# Patient Record
Sex: Male | Born: 1990 | Hispanic: No | Marital: Single | Smoking: Never smoker
Health system: Southern US, Community
[De-identification: ages and names within clinical notes are randomized; demographics above are authoritative.]

## PROBLEM LIST (undated history)

## (undated) DIAGNOSIS — F419 Anxiety disorder, unspecified: Secondary | ICD-10-CM

## (undated) DIAGNOSIS — F79 Unspecified intellectual disabilities: Secondary | ICD-10-CM

## (undated) DIAGNOSIS — F952 Tourette's disorder: Secondary | ICD-10-CM

## (undated) DIAGNOSIS — J189 Pneumonia, unspecified organism: Secondary | ICD-10-CM

## (undated) DIAGNOSIS — R4701 Aphasia: Secondary | ICD-10-CM

## (undated) DIAGNOSIS — B351 Tinea unguium: Secondary | ICD-10-CM

## (undated) DIAGNOSIS — F71 Moderate intellectual disabilities: Secondary | ICD-10-CM

## (undated) DIAGNOSIS — K219 Gastro-esophageal reflux disease without esophagitis: Secondary | ICD-10-CM

## (undated) DIAGNOSIS — F909 Attention-deficit hyperactivity disorder, unspecified type: Secondary | ICD-10-CM

## (undated) DIAGNOSIS — F840 Autistic disorder: Secondary | ICD-10-CM

---

## 2011-01-10 ENCOUNTER — Emergency Department (HOSPITAL_COMMUNITY)
Admission: EM | Admit: 2011-01-10 | Discharge: 2011-01-10 | Disposition: A | Discharge status: Home / Self Care | Payer: Medicaid Other | Attending: Emergency Medicine | Admitting: Emergency Medicine

## 2011-01-10 ENCOUNTER — Encounter: Payer: Self-pay | Admitting: Emergency Medicine

## 2011-01-10 DIAGNOSIS — F79 Unspecified intellectual disabilities: Secondary | ICD-10-CM | POA: Insufficient documentation

## 2011-01-10 DIAGNOSIS — R630 Anorexia: Secondary | ICD-10-CM | POA: Insufficient documentation

## 2011-01-10 DIAGNOSIS — F909 Attention-deficit hyperactivity disorder, unspecified type: Secondary | ICD-10-CM | POA: Insufficient documentation

## 2011-01-10 DIAGNOSIS — IMO0002 Reserved for concepts with insufficient information to code with codable children: Secondary | ICD-10-CM | POA: Insufficient documentation

## 2011-01-10 DIAGNOSIS — Z79899 Other long term (current) drug therapy: Secondary | ICD-10-CM | POA: Insufficient documentation

## 2011-01-10 DIAGNOSIS — F840 Autistic disorder: Secondary | ICD-10-CM | POA: Insufficient documentation

## 2011-01-10 DIAGNOSIS — R109 Unspecified abdominal pain: Secondary | ICD-10-CM | POA: Insufficient documentation

## 2011-01-10 DIAGNOSIS — R451 Restlessness and agitation: Secondary | ICD-10-CM

## 2011-01-10 HISTORY — DX: Attention-deficit hyperactivity disorder, unspecified type: F90.9

## 2011-01-10 HISTORY — DX: Autistic disorder: F84.0

## 2011-01-10 HISTORY — DX: Unspecified intellectual disabilities: F79

## 2011-01-10 LAB — CBC
HCT: 39.9 % (ref 39.0–52.0)
Hemoglobin: 14 g/dL (ref 13.0–17.0)
MCHC: 35.1 g/dL (ref 30.0–36.0)
RBC: 4.53 MIL/uL (ref 4.22–5.81)

## 2011-01-10 LAB — COMPREHENSIVE METABOLIC PANEL
Albumin: 4.1 g/dL (ref 3.5–5.2)
Alkaline Phosphatase: 109 U/L (ref 39–117)
BUN: 11 mg/dL (ref 6–23)
CO2: 26 mEq/L (ref 19–32)
Chloride: 103 mEq/L (ref 96–112)
Creatinine, Ser: 0.72 mg/dL (ref 0.50–1.35)
GFR calc Af Amer: >90 mL/min (ref >90)
GFR calc non Af Amer: >90 mL/min (ref >90)
Glucose, Bld: 100 mg/dL — ABNORMAL HIGH (ref 70–99)
Total Bilirubin: 0.4 mg/dL (ref 0.3–1.2)

## 2011-01-10 LAB — DIFFERENTIAL
Basophils Relative: 0 % (ref 0–1)
Lymphs Abs: 3 10*3/uL (ref 0.7–4.0)
Monocytes Absolute: 0.5 10*3/uL (ref 0.1–1.0)
Monocytes Relative: 7 % (ref 3–12)
Neutro Abs: 3.8 10*3/uL (ref 1.7–7.7)

## 2011-01-10 LAB — LIPASE, BLOOD: Lipase: 68 U/L — ABNORMAL HIGH (ref 11–59)

## 2011-01-10 MED ORDER — LORAZEPAM 1 MG PO TABS: 1.0000 mg | ORAL_TABLET | Freq: Three times a day (TID) | ORAL | Status: AC | PRN

## 2011-01-10 MED ORDER — LORAZEPAM 1 MG PO TABS
1.0000 mg | ORAL_TABLET | Freq: Once | ORAL | Status: AC
  Administered 2011-01-10: 1 mg via ORAL
  Filled 2011-01-10: qty 1

## 2011-01-10 NOTE — ED Notes (Signed)
Caregiver reports since starting new medicine, megace one month ago pt 's behavior has become very aggressive, frequent crying, decreased appetite, decreased BM's. Pt hitting, kicking, falling on floor, grabbing other people's clothes. Caregiver also reports pt cries when sitting on commode.  Reports went to PCP office today but was instructed to go to ED because pt was acting out & too aggressive in office.

## 2011-01-10 NOTE — ED Notes (Signed)
Per  Group home pt is austic non verble and has been  Getting more and more disruptive  After going to the [facility] caregiver states stools have hard  More and more disruptive

## 2011-01-10 NOTE — ED Provider Notes (Signed)
History     CSN: [id number]  Arrival date & time 01/10/11  1226   First MD Initiated Contact with Patient 01/10/11 1426      Chief Complaint  Patient presents with  . Abdominal Pain    (Consider location/radiation/quality/duration/timing/severity/associated sxs/prior treatment) Patient is a 21 y.o. male presenting with abdominal pain. The history is provided by a caregiver. The history is limited by a developmental delay.  Abdominal Pain The primary symptoms of the illness include abdominal pain.   the actual chief complaint is agitation. About one month ago, he was noted to have a decrease in appetite and he was started on megestrol and intent to increase his appetite. His appetite has not improved, but he started getting angry and disruptive shortly after starting on that medication. The behavior change has gotten worse progressively. Today, he was taken in to see his doctor and his behavior was extremely disruptive and caregiver was advised to take him to the hospital. He has not had any observed fever. He has not had any vomiting or diarrhea. There was some concern that he may be constipated and has been treated with fluids and fiber with no benefit. Symptoms are severe.  Past Medical History  Diagnosis Date  . Mental retardation   . Autism   . ADHD (attention deficit hyperactivity disorder)     History reviewed. No pertinent past surgical history.  History reviewed. No pertinent family history.  History  Substance Use Topics  . Smoking status: Never Smoker   . Smokeless tobacco: Not on file  . Alcohol Use: No      Review of Systems  Unable to perform ROS: Psychiatric disorder  Gastrointestinal: Positive for abdominal pain.    Allergies  Review of patient's allergies indicates no known allergies.  Home Medications   Current Outpatient Rx  Name Route Sig Dispense Refill  . CETIRIZINE HCL 10 MG PO TABS Oral Take 10 mg by mouth daily.      Marland Kitchen CLINDAMYCIN  PHOS-BENZOYL PEROX 1-5 % EX GEL Topical Apply 1 application topically 2 (two) times daily.      Marland Kitchen CLONIDINE HCL 0.1 MG PO TABS Oral Take 0.1 mg by mouth at bedtime.      . ENSURE PO LIQD Oral Take 237 mLs by mouth 3 (three) times daily between meals.      . MEGESTROL ACETATE 20 MG PO TABS Oral Take 10 mg by mouth daily.      Marland Kitchen RISPERIDONE 1 MG PO TABS Oral Take 1 mg by mouth 3 (three) times daily.        BP 125/83  Pulse 112  Temp(Src) 96.8 F (36 C) (Oral)  Resp 18  SpO2 95%  Physical Exam  Nursing note and vitals reviewed.  21 year old male who is frequently agitated and is requiring physical restraints to protect staff and himself. Currently, there is restraints are being done manually rather than with leather restraints. Vital signs are significant for mild tachycardia with heart rate of 112. Oxygen saturations 95% which is normal. Head is normocephalic and atraumatic. PERRLA, EOMI. There is no scleral icterus. His membranes are moist. Neck is supple without adenopathy. Lungs are clear without rales, wheezes, rhonchi. Heart has regular rate and rhythm without murmur. Back is nontender. There is no chest wall tenderness. Abdomen is soft, flat, nontender without masses or hepatosplenomegaly and peristalsis is normal active. Jimmy's have no cyanosis or edema. No deformities are seen. Skin is warm and moist without rash. Neurologic: He is nonverbal  and does not follow commands but he does have purposeful movement and he does observe interact with his environment. Cranial nerves are intact. There no focal motor or sensory deficits.  ED Course  Procedures (including critical care time)   Labs Reviewed  CBC  DIFFERENTIAL  COMPREHENSIVE METABOLIC PANEL  LIPASE, BLOOD  URINALYSIS, ROUTINE W REFLEX MICROSCOPIC   No results found.  Results for orders placed during the hospital encounter of 01/10/11  CBC      Component Value Range   WBC 7.3  4.0 - 10.5 (K/uL)   RBC 4.53  4.22 - 5.81  (MIL/uL)   Hemoglobin 14.0  13.0 - 17.0 (g/dL)   HCT 16.1  09.6 - 04.5 (%)   MCV 88.1  78.0 - 100.0 (fL)   MCH 30.9  26.0 - 34.0 (pg)   MCHC 35.1  30.0 - 36.0 (g/dL)   RDW 40.9  81.1 - 91.4 (%)   Platelets 196  150 - 400 (K/uL)  DIFFERENTIAL      Component Value Range   Neutrophils Relative 52  43 - 77 (%)   Neutro Abs 3.8  1.7 - 7.7 (K/uL)   Lymphocytes Relative 40  12 - 46 (%)   Lymphs Abs 3.0  0.7 - 4.0 (K/uL)   Monocytes Relative 7  3 - 12 (%)   Monocytes Absolute 0.5  0.1 - 1.0 (K/uL)   Eosinophils Relative 1  0 - 5 (%)   Eosinophils Absolute 0.1  0.0 - 0.7 (K/uL)   Basophils Relative 0  0 - 1 (%)   Basophils Absolute 0.0  0.0 - 0.1 (K/uL)  COMPREHENSIVE METABOLIC PANEL      Component Value Range   Sodium 139  135 - 145 (mEq/L)   Potassium 3.7  3.5 - 5.1 (mEq/L)   Chloride 103  96 - 112 (mEq/L)   CO2 26  19 - 32 (mEq/L)   Glucose, Bld 100 (*) 70 - 99 (mg/dL)   BUN 11  6 - 23 (mg/dL)   Creatinine, Ser 7.82  0.50 - 1.35 (mg/dL)   Calcium 9.4  8.4 - 95.6 (mg/dL)   Total Protein 7.9  6.0 - 8.3 (g/dL)   Albumin 4.1  3.5 - 5.2 (g/dL)   AST 17  0 - 37 (U/L)   ALT <5  0 - 53 (U/L)   Alkaline Phosphatase 109  39 - 117 (U/L)   Total Bilirubin 0.4  0.3 - 1.2 (mg/dL)   GFR calc non Af Amer >90  >90 (mL/min)   GFR calc Af Amer >90  >90 (mL/min)  LIPASE, BLOOD      Component Value Range   Lipase 68 (*) 11 - 59 (U/L)   No results found.   No diagnosis found.  He was given a dose of Ativan and following this, he calmed down considerably and did not have to be restrained anymore. At this point, he is resting comfortably and does not appear to be in any distress whatsoever. Laboratory workup was significant for mildly elevated lipase. Clinically, I do not feel he has pancreatitis. He does not appear to have significant abdominal pain and has not had any vomiting. He also has no risk factors for developing pancreatitis-specifically, no excess alcohol intake, no known gallstones, no  known hypertriglyceridemia. He will be given a prescription for Ativan to take as needed since he did seem to get good relief of his agitation in the emergency department tonight. In addition, I am suggesting that they discontinue megestrol  to see if that was contributing to his behavior.  Impression: agitation, possibly related to megestrol.  MDM  Behavior change which may be related to starting megestrol. I see no evidence of significant abdominal pathology. Laboratory workup will be done to ensure there is nonmedical urinary tract infection and no evidence of occult gastrointestinal problem. If workup is negative, I will recommend discontinuing megestrol to see if he improves with that.        Dione Booze, MD 01/10/11 1556

## 2011-01-10 NOTE — Discharge Instructions (Signed)
Please discontinue megestrol. This maybe be what is causing his agitation. It will take about one week off of megestrol to see if it is indeed contributing to his agitation. If he is not showing significant improvement in one week, he will need to be reevaluated for possible additional medication to control his agitation. In the meantime, give Ativan 1 mg as often as 3 times a day as needed to control his agitation.

## 2012-12-03 DIAGNOSIS — J189 Pneumonia, unspecified organism: Secondary | ICD-10-CM

## 2012-12-03 HISTORY — DX: Pneumonia, unspecified organism: J18.9

## 2012-12-29 ENCOUNTER — Encounter (HOSPITAL_COMMUNITY): Payer: Self-pay | Admitting: Emergency Medicine

## 2012-12-29 ENCOUNTER — Emergency Department (HOSPITAL_COMMUNITY)
Admission: EM | Admit: 2012-12-29 | Discharge: 2012-12-29 | Discharge status: Against Medical Advice | Payer: Medicaid Other | Attending: Emergency Medicine | Admitting: Emergency Medicine

## 2012-12-29 DIAGNOSIS — R509 Fever, unspecified: Secondary | ICD-10-CM | POA: Insufficient documentation

## 2012-12-29 MED ORDER — IBUPROFEN 200 MG PO TABS
600.0000 mg | ORAL_TABLET | Freq: Once | ORAL | Status: AC
  Administered 2012-12-29: 600 mg via ORAL
  Filled 2012-12-29: qty 3

## 2012-12-29 NOTE — ED Notes (Signed)
[name] mom gave him 400 mg tylenol about 2 hrs ago.

## 2012-12-29 NOTE — ED Notes (Signed)
Pt is in foster care and has been for 8 years.  Pt is autistic.  [name] mom states that he has been having fevers since yesterday.  States that she has been giving him fluids and tylenol around the clock but cannot seem to get his fever to go down.  Denies NVD.  States that he has a nonproductive cough but seems like he is trying to get something up.  Pt is nonverbal.

## 2012-12-29 NOTE — ED Notes (Signed)
Pt to registration window stating they weren't waiting any longer

## 2012-12-30 ENCOUNTER — Encounter (HOSPITAL_COMMUNITY): Payer: Self-pay | Admitting: Emergency Medicine

## 2012-12-30 ENCOUNTER — Emergency Department (HOSPITAL_COMMUNITY)
Admission: EM | Admit: 2012-12-30 | Discharge: 2012-12-30 | Disposition: A | Discharge status: Home / Self Care | Payer: Medicaid Other | Attending: Emergency Medicine | Admitting: Emergency Medicine

## 2012-12-30 ENCOUNTER — Emergency Department (HOSPITAL_COMMUNITY): Payer: Medicaid Other

## 2012-12-30 DIAGNOSIS — F840 Autistic disorder: Secondary | ICD-10-CM | POA: Insufficient documentation

## 2012-12-30 DIAGNOSIS — Z79899 Other long term (current) drug therapy: Secondary | ICD-10-CM | POA: Insufficient documentation

## 2012-12-30 DIAGNOSIS — R05 Cough: Secondary | ICD-10-CM

## 2012-12-30 DIAGNOSIS — F909 Attention-deficit hyperactivity disorder, unspecified type: Secondary | ICD-10-CM | POA: Insufficient documentation

## 2012-12-30 DIAGNOSIS — Z792 Long term (current) use of antibiotics: Secondary | ICD-10-CM | POA: Insufficient documentation

## 2012-12-30 DIAGNOSIS — R059 Cough, unspecified: Secondary | ICD-10-CM

## 2012-12-30 DIAGNOSIS — J159 Unspecified bacterial pneumonia: Secondary | ICD-10-CM | POA: Insufficient documentation

## 2012-12-30 DIAGNOSIS — J189 Pneumonia, unspecified organism: Secondary | ICD-10-CM

## 2012-12-30 DIAGNOSIS — R197 Diarrhea, unspecified: Secondary | ICD-10-CM

## 2012-12-30 DIAGNOSIS — R509 Fever, unspecified: Secondary | ICD-10-CM

## 2012-12-30 MED ORDER — AZITHROMYCIN 250 MG PO TABS: 250.0000 mg | ORAL_TABLET | Freq: Every day | ORAL | Status: DC

## 2012-12-30 MED ORDER — AZITHROMYCIN 250 MG PO TABS
500.0000 mg | ORAL_TABLET | Freq: Once | ORAL | Status: AC
  Administered 2012-12-30: 500 mg via ORAL
  Filled 2012-12-30: qty 2

## 2012-12-30 NOTE — ED Provider Notes (Signed)
CSN: [id number]     Arrival date & time 12/30/12  7829 History   First MD Initiated Contact with Patient 12/30/12 [phone]     Chief Complaint  Patient presents with  . Diarrhea   (Consider location/radiation/quality/duration/timing/severity/associated sxs/prior Treatment) HPI A LEVEL 5 CAVEAT PERTAINS DUE TO NONVERBAL PATIENT Pt with hx of autism, MR presenting with fever, cough and diarrhea.  Symptoms started yesterday morning with fever.  Cough began last night along with several loose stools- no blood or mucous.  No sick contacts.  He has continued drinking liquids.  No vomiting.  No difficulty breathing.  Pt is nonverbal and he is not able to contribute to the history.  Hx obtained from his caregiver.    Past Medical History  Diagnosis Date  . Mental retardation   . Autism   . ADHD (attention deficit hyperactivity disorder)    History reviewed. No pertinent past surgical history. No family history on file. History  Substance Use Topics  . Smoking status: Never Smoker   . Smokeless tobacco: Not on file  . Alcohol Use: No    Review of Systems UNABLE TO OBTAIN ROS DUE TO LEVEL 5 CAVEAT  Allergies  Review of patient's allergies indicates no known allergies.  Home Medications   Current Outpatient Rx  Name  Route  Sig  Dispense  Refill  . cetirizine (ZYRTEC) 10 MG tablet   Oral   Take 10 mg by mouth daily.          . clindamycin-benzoyl peroxide (BENZACLIN) gel   Topical   Apply 1 application topically 2 (two) times daily.          . cloNIDine (CATAPRES) 0.1 MG tablet   Oral   Take 0.1 mg by mouth at bedtime.           . risperiDONE (RISPERDAL) 1 MG tablet   Oral   Take 1 mg by mouth 3 (three) times daily.           Marland Kitchen azithromycin (ZITHROMAX Z-PAK) 250 MG tablet   Oral   Take 1 tablet (250 mg total) by mouth daily.   4 tablet   0    BP 110/86  Pulse 121  Temp(Src) 98.2 F (36.8 C) (Oral)  Resp 18  SpO2 98% Vitals reviewed Physical Exam Physical  Examination: General appearance - alert, well appearing, and in no distress Mental status - alert, unable to assess orientation, nonverbal at baseline but following some commands, at his baseline per caregiver Eyes - no scleral icterus, no conjunctival injection Ears - bilateral TM's and external ear canals normal Mouth - mucous membranes moist, pharynx normal without lesions Chest - clear to auscultation, no wheezes, rales or rhonchi, symmetric air entry, no increased respiratory effort Heart - normal rate, regular rhythm, normal S1, S2, no murmurs, rubs, clicks or gallops Abdomen - soft, nontender, nondistended, no masses or organomegaly Extremities - peripheral pulses normal, no pedal edema, no clubbing or cyanosis Skin - normal coloration and turgor, no rashes  ED Course  Procedures (including critical care time) Labs Review Labs Reviewed - No data to display Imaging Review Dg Chest 2 View  12/30/2012   CLINICAL DATA:  Cough and fever with nausea and vomiting and diarrhea.  EXAM: CHEST  2 VIEW  COMPARISON:  None.  FINDINGS: The lungs are mildly hyperinflated. There are coarse lung markings projecting over the lower thoracic spine on the lateral film which the likely lie on the right consistent with atelectasis or pneumonia.  The cardiopericardial silhouette is normal in size. The pulmonary vascularity is not engorged. The mediastinum is normal in width. There is no pleural effusion or pneumothorax.  IMPRESSION: The findings are consistent with a right lower lobe pneumonia posteriorly. There is likely underlying reactive airway disease.   Electronically Signed   By: David  [place]   On: 12/30/2012 10:58    EKG Interpretation   None       MDM   1. Community acquired pneumonia   2. Fever   3. Cough   4. Diarrhea    Pt presenting with c/o fever, cough and diarrhea.  CXR reveals pneumonia, xray images reviewed and interpreted by me as well.  Pt appears overall nontoxic and well  hydrated.  Vitalls improving.  Given first dose of antibiotics in the ED.  Discharged with strict return precautions.  Caregiver agreeable with plan.    Ethelda Chick, MD 12/30/12 1323

## 2012-12-30 NOTE — ED Notes (Signed)
Per caregiver, fever on and off since Fri-temp this am 103.0-was given 2 ibuprofen at 0739-diarrhea/cough started last night-

## 2013-01-01 ENCOUNTER — Emergency Department (HOSPITAL_COMMUNITY)
Admission: EM | Admit: 2013-01-01 | Discharge: 2013-01-01 | Disposition: A | Discharge status: Home / Self Care | Payer: Medicaid Other | Attending: Emergency Medicine | Admitting: Emergency Medicine

## 2013-01-01 ENCOUNTER — Emergency Department (INDEPENDENT_AMBULATORY_CARE_PROVIDER_SITE_OTHER): Payer: Medicaid Other

## 2013-01-01 ENCOUNTER — Encounter (HOSPITAL_COMMUNITY): Payer: Self-pay | Admitting: Emergency Medicine

## 2013-01-01 ENCOUNTER — Emergency Department (HOSPITAL_COMMUNITY)
Admission: EM | Admit: 2013-01-01 | Discharge: 2013-01-01 | Disposition: A | Discharge status: Nursing Home (Includes ICF) | Payer: Medicaid Other | Source: Home / Self Care

## 2013-01-01 DIAGNOSIS — F909 Attention-deficit hyperactivity disorder, unspecified type: Secondary | ICD-10-CM | POA: Insufficient documentation

## 2013-01-01 DIAGNOSIS — F840 Autistic disorder: Secondary | ICD-10-CM | POA: Insufficient documentation

## 2013-01-01 DIAGNOSIS — J189 Pneumonia, unspecified organism: Secondary | ICD-10-CM

## 2013-01-01 DIAGNOSIS — R197 Diarrhea, unspecified: Secondary | ICD-10-CM

## 2013-01-01 DIAGNOSIS — R509 Fever, unspecified: Secondary | ICD-10-CM | POA: Insufficient documentation

## 2013-01-01 DIAGNOSIS — Z79899 Other long term (current) drug therapy: Secondary | ICD-10-CM | POA: Insufficient documentation

## 2013-01-01 DIAGNOSIS — F79 Unspecified intellectual disabilities: Secondary | ICD-10-CM | POA: Insufficient documentation

## 2013-01-01 LAB — CBC WITH DIFFERENTIAL/PLATELET
Basophils Absolute: 0 10*3/uL (ref 0.0–0.1)
Basophils Relative: 0 % (ref 0–1)
Lymphocytes Relative: 20 % (ref 12–46)
MCHC: 35.3 g/dL (ref 30.0–36.0)
Neutro Abs: 3.3 10*3/uL (ref 1.7–7.7)
Neutrophils Relative %: 72 % (ref 43–77)
Platelets: 144 10*3/uL — ABNORMAL LOW (ref 150–400)
RDW: 12.1 % (ref 11.5–15.5)
WBC: 4.5 10*3/uL (ref 4.0–10.5)

## 2013-01-01 LAB — BASIC METABOLIC PANEL
BUN: 6 mg/dL (ref 6–23)
CO2: 24 mEq/L (ref 19–32)
Calcium: 8.4 mg/dL (ref 8.4–10.5)
Chloride: 100 mEq/L (ref 96–112)
Creatinine, Ser: 0.7 mg/dL (ref 0.50–1.35)
GFR calc Af Amer: >90 mL/min (ref >90)
GFR calc non Af Amer: >90 mL/min (ref >90)
Glucose, Bld: 160 mg/dL — ABNORMAL HIGH (ref 70–99)
Potassium: 3.9 mEq/L (ref 3.7–5.3)
Sodium: 139 mEq/L (ref 137–147)

## 2013-01-01 MED ORDER — LEVOFLOXACIN IN D5W 750 MG/150ML IV SOLN
750.0000 mg | Freq: Once | INTRAVENOUS | Status: AC
  Administered 2013-01-01: 750 mg via INTRAVENOUS
  Filled 2013-01-01: qty 150

## 2013-01-01 MED ORDER — LEVOFLOXACIN 750 MG PO TABS: 750.0000 mg | ORAL_TABLET | Freq: Every day | ORAL | Status: DC

## 2013-01-01 NOTE — ED Notes (Signed)
pcp told caregiver to bring patient to ucc for a chest xray.  Diagnosed Sunday with pneumonia

## 2013-01-01 NOTE — ED Provider Notes (Signed)
CSN: [id number]     Arrival date & time 01/01/13  1449 History   First MD Initiated Contact with Patient 01/01/13 1540     Chief Complaint  Patient presents with  . Pneumonia   (Consider location/radiation/quality/duration/timing/severity/associated sxs/prior Treatment) HPI Comments: Patient sent to the emergency department from urgent care for repeat evaluation of pneumonia. Patient was seen in the emergency department [facility] long days ago for fever and was diagnosed with pneumonia. Patient was started on Zithromax. He has been getting the medication as instructed. He was supposed to followup with his primary care doctor today, there were no appointments. Patient went to urgent care and a repeat x-ray showed worsening of the pneumonia, so patient was sent to the ER. Caregiver reports that he has been whiny, like he feels bad, but no specific findings. No vomiting or diarrhea. Has continued to run a fever, but is not exhibiting any difficulty breathing. Patient is a difficult examination because of his history of autism and mental retardation. All information provided by caregiver.  Patient is a 22 y.o. male presenting with pneumonia.  Pneumonia Pertinent negatives include no shortness of breath.    Past Medical History  Diagnosis Date  . Mental retardation   . Autism   . ADHD (attention deficit hyperactivity disorder)    History reviewed. No pertinent past surgical history. History reviewed. No pertinent family history. History  Substance Use Topics  . Smoking status: Never Smoker   . Smokeless tobacco: Not on file  . Alcohol Use: No    Review of Systems  Constitutional: Positive for fever.  Respiratory: Positive for cough. Negative for shortness of breath.   All other systems reviewed and are negative.    Allergies  Review of patient's allergies indicates no known allergies.  Home Medications   Current Outpatient Rx  Name  Route  Sig  Dispense  Refill  . azithromycin  (ZITHROMAX Z-PAK) 250 MG tablet   Oral   Take 1 tablet (250 mg total) by mouth daily.   4 tablet   0   . cetirizine (ZYRTEC) 10 MG tablet   Oral   Take 10 mg by mouth daily.          . clindamycin-benzoyl peroxide (BENZACLIN) gel   Topical   Apply 1 application topically 2 (two) times daily.          . cloNIDine (CATAPRES) 0.1 MG tablet   Oral   Take 0.1 mg by mouth at bedtime.           . risperiDONE (RISPERDAL) 1 MG tablet   Oral   Take 1 mg by mouth 3 (three) times daily.            BP 116/66  Pulse 105  Temp(Src) 99.2 F (37.3 C) (Oral)  Resp 18  SpO2 95% Physical Exam  Constitutional: He appears well-developed and well-nourished. No distress.  HENT:  Head: Normocephalic and atraumatic.  Right Ear: Hearing normal.  Left Ear: Hearing normal.  Nose: Nose normal.  Mouth/Throat: Oropharynx is clear and moist and mucous membranes are normal.  Eyes: Conjunctivae and EOM are normal. Pupils are equal, round, and reactive to light.  Neck: Normal range of motion. Neck supple.  Cardiovascular: Regular rhythm, S1 normal and S2 normal.  Exam reveals no gallop and no friction rub.   No murmur heard. Pulmonary/Chest: Effort normal and breath sounds normal. No respiratory distress. He exhibits no tenderness.  Abdominal: Soft. Normal appearance and bowel sounds are normal. There is  no hepatosplenomegaly. There is no tenderness. There is no rebound, no guarding, no tenderness at McBurney's point and negative Murphy's sign. No hernia.  Musculoskeletal: Normal range of motion.  Neurological: He is alert. He has normal strength. No cranial nerve deficit or sensory deficit. Coordination normal. GCS eye subscore is 4. GCS verbal subscore is 5. GCS motor subscore is 6.  Non-communicative  Skin: Skin is warm, dry and intact. No rash noted. No cyanosis.  Psychiatric: He has a normal mood and affect. His speech is normal and behavior is normal. Thought content normal.    ED Course   Procedures (including critical care time) Labs Review Labs Reviewed  CULTURE, BLOOD (ROUTINE X 2)  CULTURE, BLOOD (ROUTINE X 2)  CBC WITH DIFFERENTIAL  BASIC METABOLIC PANEL   Imaging Review Dg Chest 2 View  01/01/2013   CLINICAL DATA:  Cough.  Pneumonia.  EXAM: CHEST  2 VIEW  COMPARISON:  12/30/2012.  FINDINGS: Prominent right lower lobe pneumonia noted on today's examination. Infiltrates progressed from prior study. Heart is unremarkable. Normal pulmonary vascularity. Tiny right pleural effusion cannot be excluded. No pneumothorax.  IMPRESSION: Progressive right lower lobe pneumonia.   Electronically Signed   By: Maisie Fus  Register   On: 01/01/2013 13:31    EKG Interpretation   None       MDM  Diagnosis: Community Acquired Pneumonia  Patient sent to the ER from urgent care for evaluation of possible worsening pneumonia. Patient continues to run a fever. Chest x-ray performed today shows progression of infiltrate compared to x-ray from 2 days ago. Examination of the patient, however, reveals that he is breathing comfortably without any distress. Oxygenation is normal. He is not requiring any supplemental oxygen. Lab work is otherwise unremarkable. Patient administered IV Levaquin and will be switched to by mouth Levaquin. At this time, as his vital signs are normal and his breathing is unlabored, continue outpatient therapy.  Gilda Crease, MD 01/01/13 Ebony Cargo

## 2013-01-01 NOTE — ED Notes (Signed)
MD at bedside. 

## 2013-01-01 NOTE — ED Provider Notes (Signed)
CSN: [id number]     Arrival date & time 01/01/13  4098 History   First MD Initiated Contact with Patient 01/01/13 1231     Chief Complaint  Patient presents with  . Pneumonia   (Consider location/radiation/quality/duration/timing/severity/associated sxs/prior Treatment) HPI Comments: This 22 year old male with autism, mental retardation and ADHD was seen at [facility] 2 days ago for fever, cough and diarrhea.  chest x-ray revealed a pneumonia the patient was started on azithromycin. He was advised to followup with his PCP today. When the patient's caregiver take him to the PCP the office was full and they were unable to see him and therefore sent him to the urgent care. The caregiver states he has been compliant with his medications. Caretaker states also that his temperature continues at 103.2 earlier today. He defervesced his with Motrin but the fever returns. He still has a cough and diarrhea.    Past Medical History  Diagnosis Date  . Mental retardation   . Autism   . ADHD (attention deficit hyperactivity disorder)    History reviewed. No pertinent past surgical history. No family history on file. History  Substance Use Topics  . Smoking status: Never Smoker   . Smokeless tobacco: Not on file  . Alcohol Use: No    Review of Systems  Unable to perform ROS Constitutional: Positive for fever.  Respiratory: Positive for cough.   Gastrointestinal: Positive for diarrhea.    Allergies  Review of patient's allergies indicates no known allergies.  Home Medications   Current Outpatient Rx  Name  Route  Sig  Dispense  Refill  . azithromycin (ZITHROMAX Z-PAK) 250 MG tablet   Oral   Take 1 tablet (250 mg total) by mouth daily.   4 tablet   0   . cetirizine (ZYRTEC) 10 MG tablet   Oral   Take 10 mg by mouth daily.          . clindamycin-benzoyl peroxide (BENZACLIN) gel   Topical   Apply 1 application topically 2 (two) times daily.          . cloNIDine  (CATAPRES) 0.1 MG tablet   Oral   Take 0.1 mg by mouth at bedtime.           . risperiDONE (RISPERDAL) 1 MG tablet   Oral   Take 1 mg by mouth 3 (three) times daily.            BP 118/78  Pulse 110  Temp(Src) 97.8 F (36.6 C) (Oral)  Resp 20  SpO2 98% Physical Exam  Nursing note and vitals reviewed. Constitutional: No distress.  Neck: Normal range of motion. Neck supple.  Cardiovascular: Normal rate and regular rhythm.   Tachycardia  Pulmonary/Chest: Effort normal. No respiratory distress.  The patient will not take a deep breath for auscultation and equal continues to moan or make a sound during each expiration. Evaluation of lung sounds are suboptimal.  Musculoskeletal: He exhibits no edema.  Neurological: He is alert.  Skin: Skin is warm and dry. No rash noted.    ED Course  Procedures (including critical care time) Labs Review Labs Reviewed - No data to display Imaging Review Dg Chest 2 View  01/01/2013   CLINICAL DATA:  Cough.  Pneumonia.  EXAM: CHEST  2 VIEW  COMPARISON:  12/30/2012.  FINDINGS: Prominent right lower lobe pneumonia noted on today's examination. Infiltrates progressed from prior study. Heart is unremarkable. Normal pulmonary vascularity. Tiny right pleural effusion cannot be excluded. No pneumothorax.  IMPRESSION: Progressive right lower lobe pneumonia.   Electronically Signed   By: Maisie Fus  Register   On: 01/01/2013 13:31       MDM   1. Community acquired pneumonia   2. Diarrhea    The patient is being transported via shuttle to the emergency department for management of progressive right lower lobe pneumonia over the past 48 hours after failing outpatient treatment.     Hayden Rasmussen, NP 01/01/13 1415

## 2013-01-01 NOTE — ED Notes (Addendum)
Pt in from urgent care, states he was dx on Sunday with pneumonia and has been taking antibiotics since that time, today went to urgent care today because they were told to follow up and his PMD was not available, urgent care sent patient here for further eval, states they had a repeat xray and based on that they were told patient needs to be admitted, states he has been less active than normal but is still drinking fluids well

## 2013-01-01 NOTE — ED Provider Notes (Signed)
Medical screening examination/treatment/procedure(s) were performed by non-physician practitioner and as supervising physician I was immediately available for consultation/collaboration.  Pollyann Roa, M.D.  Rain Friedt C Nehal Shives, MD 01/01/13 1524 

## 2013-01-07 LAB — CULTURE, BLOOD (ROUTINE X 2): Culture: NO GROWTH

## 2013-01-08 LAB — CULTURE, BLOOD (ROUTINE X 2): Culture: NO GROWTH

## 2013-04-18 ENCOUNTER — Emergency Department (HOSPITAL_COMMUNITY)
Admission: EM | Admit: 2013-04-18 | Discharge: 2013-04-18 | Disposition: A | Discharge status: Home / Self Care | Payer: Medicaid Other | Attending: Emergency Medicine | Admitting: Emergency Medicine

## 2013-04-18 ENCOUNTER — Encounter (HOSPITAL_COMMUNITY): Payer: Self-pay | Admitting: Emergency Medicine

## 2013-04-18 DIAGNOSIS — F909 Attention-deficit hyperactivity disorder, unspecified type: Secondary | ICD-10-CM | POA: Insufficient documentation

## 2013-04-18 DIAGNOSIS — Z79899 Other long term (current) drug therapy: Secondary | ICD-10-CM | POA: Insufficient documentation

## 2013-04-18 DIAGNOSIS — M79609 Pain in unspecified limb: Secondary | ICD-10-CM | POA: Insufficient documentation

## 2013-04-18 DIAGNOSIS — F840 Autistic disorder: Secondary | ICD-10-CM | POA: Insufficient documentation

## 2013-04-18 DIAGNOSIS — M79675 Pain in left toe(s): Secondary | ICD-10-CM

## 2013-04-18 DIAGNOSIS — Z792 Long term (current) use of antibiotics: Secondary | ICD-10-CM | POA: Insufficient documentation

## 2013-04-18 MED ORDER — ACETAMINOPHEN 160 MG/5ML PO SOLN
500.0000 mg | ORAL | Status: DC | PRN
  Administered 2013-04-18: 500 mg via ORAL
  Filled 2013-04-18: qty 20.3

## 2013-04-18 MED ORDER — ACETAMINOPHEN 160 MG/5ML PO LIQD: 500.0000 mg | Freq: Four times a day (QID) | ORAL | Status: DC | PRN

## 2013-04-18 NOTE — ED Provider Notes (Signed)
CSN: [id number]     Arrival date & time 04/18/13  1145 History  This chart was scribed for non-physician practitioner, Emilia BeckKaitlyn Donovyn Guidice, PA-C working with Laray AngerKathleen M McManus, DO by Greggory StallionKayla Andersen, ED scribe. This patient was seen in room TR11C/TR11C and the patient's care was started at 12:02 PM.   Chief Complaint  Patient presents with  . Foot Pain   The history is provided by a parent. No language interpreter was used.   HPI Comments: Joel StarrCharlie Picinich is a 23 y.o. male with history of autism and mental retardation who presents to the Emergency Department complaining of left first toe pain that started 2 days ago. Pt has history of toenail problems that he has seen a podiatrist for. His mother states he has been limping for the last two days. No known injury. Pain worse with palpation.    Past Medical History  Diagnosis Date  . Mental retardation   . Autism   . ADHD (attention deficit hyperactivity disorder)    History reviewed. No pertinent past surgical history. No family history on file. History  Substance Use Topics  . Smoking status: Never Smoker   . Smokeless tobacco: Not on file  . Alcohol Use: No    Review of Systems  Unable to perform ROS: Patient nonverbal   Allergies  Review of patient's allergies indicates no known allergies.  Home Medications   Prior to Admission medications   Medication Sig Start Date End Date Taking? Authorizing Provider  cetirizine (ZYRTEC) 10 MG tablet Take 10 mg by mouth daily.     Historical Provider, MD  clindamycin-benzoyl peroxide (BENZACLIN) gel Apply 1 application topically 2 (two) times daily.     Historical Provider, MD  cloNIDine (CATAPRES) 0.1 MG tablet Take 0.1 mg by mouth at bedtime.      Historical Provider, MD  levofloxacin (LEVAQUIN) 750 MG tablet Take 1 tablet (750 mg total) by mouth daily. 01/01/13   Gilda Creasehristopher J. Pollina, MD  risperiDONE (RISPERDAL) 1 MG tablet Take 1 mg by mouth 3 (three) times daily.      Historical  Provider, MD   BP 128/74  Pulse 88  Temp(Src) 98 F (36.7 C) (Oral)  Resp 18  SpO2 100%  Physical Exam  Nursing note and vitals reviewed. Constitutional: He is oriented to person, place, and time. He appears well-developed and well-nourished. No distress.  HENT:  Head: Normocephalic and atraumatic.  Eyes: EOM are normal.  Neck: Neck supple. No tracheal deviation present.  Cardiovascular: Normal rate.   Pulmonary/Chest: Effort normal. No respiratory distress.  Musculoskeletal: Normal range of motion.  Bilateral thick and darkened toenails. No redness or obvious deformity of left great toe. No tenderness to palpation.   Neurological: He is alert and oriented to person, place, and time.  Skin: Skin is warm and dry.  Psychiatric: He has a normal mood and affect. His behavior is normal.    ED Course  Procedures (including critical care time)  DIAGNOSTIC STUDIES: Oxygen Saturation is 100% on RA, normal by my interpretation.    COORDINATION OF CARE: 12:04 PM-Discussed treatment plan which includes tylenol in the ED with pt at bedside and pt agreed to plan. Advised pt to follow up with his podiatrist.   Labs Review Labs Reviewed - No data to display  Imaging Review No results found.   EKG Interpretation None      MDM   Final diagnoses:  Pain of left great toe    Patient does not appear to have any  acute injury. No xray needed at this time. The toe does not appear infected. Patient's guardian advised to take the patient to his podiatrist.   I personally performed the services described in this documentation, which was scribed in my presence. The recorded information has been reviewed and is accurate.  Emilia BeckKaitlyn Izaiah Tabb, PA-C 04/18/13 1545

## 2013-04-18 NOTE — ED Notes (Signed)
Pt is autistic. Mother brought pt to ED for c/o left great toe pain.

## 2013-04-18 NOTE — Discharge Instructions (Signed)
Follow up with the podiatrist. Take tylenol as needed for pain.

## 2013-04-19 NOTE — ED Provider Notes (Signed)
Medical screening examination/treatment/procedure(s) were performed by non-physician practitioner and as supervising physician I was immediately available for consultation/collaboration.   EKG Interpretation None        Judia Arnott M Taygen Acklin, DO 04/19/13 0728 

## 2013-04-24 ENCOUNTER — Ambulatory Visit (INDEPENDENT_AMBULATORY_CARE_PROVIDER_SITE_OTHER): Payer: Medicaid Other | Admitting: Podiatry

## 2013-04-24 ENCOUNTER — Encounter: Payer: Self-pay | Admitting: Podiatry

## 2013-04-24 VITALS — BP 123/72 | HR 80

## 2013-04-24 DIAGNOSIS — M79609 Pain in unspecified limb: Secondary | ICD-10-CM

## 2013-04-24 DIAGNOSIS — L603 Nail dystrophy: Secondary | ICD-10-CM | POA: Insufficient documentation

## 2013-04-24 DIAGNOSIS — M79606 Pain in leg, unspecified: Secondary | ICD-10-CM

## 2013-04-24 DIAGNOSIS — B351 Tinea unguium: Secondary | ICD-10-CM

## 2013-04-24 NOTE — Progress Notes (Signed)
Subjective: 23 year old male presents with care taker. He is none verbal and none communicable.  Care taker stated that people noticed him limping from pain and noted of abnormal nail growth on both great toes.  Objective: Dermatologic: Thick discolored dystrophic nails on both great toes. No soft tissue infection or inflammation noted.  Neurovascular status are within normal. No gross osseous deformities noted.  Assessment: Painful dystrophic nails both great toes.  Plan: All nails debrided. Both great toe nail plates were grinded down to normal thickness. Return in 3 months or as needed.

## 2013-04-24 NOTE — Patient Instructions (Signed)
Seen for painful toe. Nails are thick and deformed. All nails debrided. Need periodic care for deformed nail. Return in 3 month.

## 2013-07-18 ENCOUNTER — Ambulatory Visit (HOSPITAL_COMMUNITY)
Admission: RE | Admit: 2013-07-18 | Discharge: 2013-07-18 | Disposition: A | Discharge status: Home / Self Care | Payer: Medicaid Other | Source: Ambulatory Visit | Attending: Internal Medicine | Admitting: Internal Medicine

## 2013-07-18 ENCOUNTER — Other Ambulatory Visit (HOSPITAL_COMMUNITY): Payer: Self-pay | Admitting: Internal Medicine

## 2013-07-18 DIAGNOSIS — J984 Other disorders of lung: Secondary | ICD-10-CM | POA: Insufficient documentation

## 2013-07-18 DIAGNOSIS — R05 Cough: Secondary | ICD-10-CM

## 2013-07-18 DIAGNOSIS — R059 Cough, unspecified: Secondary | ICD-10-CM

## 2013-07-31 ENCOUNTER — Encounter: Payer: Self-pay | Admitting: Podiatry

## 2013-07-31 ENCOUNTER — Ambulatory Visit (INDEPENDENT_AMBULATORY_CARE_PROVIDER_SITE_OTHER): Payer: Medicaid Other | Admitting: Podiatry

## 2013-07-31 VITALS — BP 106/62 | HR 72

## 2013-07-31 DIAGNOSIS — M79609 Pain in unspecified limb: Secondary | ICD-10-CM

## 2013-07-31 DIAGNOSIS — M79606 Pain in leg, unspecified: Secondary | ICD-10-CM

## 2013-07-31 DIAGNOSIS — L603 Nail dystrophy: Secondary | ICD-10-CM

## 2013-07-31 DIAGNOSIS — B351 Tinea unguium: Secondary | ICD-10-CM

## 2013-07-31 MED ORDER — CICLOPIROX 8 % EX SOLN: Freq: Every day | CUTANEOUS | Status: DC

## 2013-07-31 NOTE — Patient Instructions (Signed)
Seen for abnormal toe nails. All debrided. Prescribed Penlac. Follow instruction. Return in 3 months.

## 2013-07-31 NOTE — Progress Notes (Signed)
Subjective:  23 year old male presents with care taker for follow up on bad toe nails. He is none verbal and none communicable.   Objective:  Dermatologic: Thick discolored dystrophic nails on both great toes. No soft tissue infection or inflammation noted.  Neurovascular status are within normal.  No gross osseous deformities noted.   Assessment: Painful dystrophic nails both great toes.   Plan: All nails debrided.  Both great toe nail plates were grinded down to normal thickness.  Advised to use Penlac.  Return in 3 months or as needed.

## 2013-11-01 ENCOUNTER — Encounter: Payer: Self-pay | Admitting: Podiatry

## 2013-11-01 ENCOUNTER — Ambulatory Visit (INDEPENDENT_AMBULATORY_CARE_PROVIDER_SITE_OTHER): Payer: Medicaid Other | Admitting: Podiatry

## 2013-11-01 VITALS — BP 106/58 | HR 82

## 2013-11-01 DIAGNOSIS — L600 Ingrowing nail: Secondary | ICD-10-CM

## 2013-11-01 DIAGNOSIS — L603 Nail dystrophy: Secondary | ICD-10-CM

## 2013-11-01 DIAGNOSIS — M79606 Pain in leg, unspecified: Secondary | ICD-10-CM

## 2013-11-01 NOTE — Patient Instructions (Signed)
Improving fungal nails on both great toes. Continue with Penlac application. Return in 3 months.

## 2013-11-01 NOTE — Progress Notes (Signed)
Subjective:  23 year old male presents with care taker for follow up on bad toe nails on both great toes. He is none verbal and none communicable.  Care taker has been using Penlac daily as instructed for his bad nails. She noted of improvement.   Objective:  Dermatologic: Thick discolored dystrophic nails on both great toes.  No soft tissue infection or inflammation noted.  Neurovascular status are within normal.  No gross osseous deformities noted.   Assessment: Painful dystrophic nails both great toes.   Plan: Both big toe nails debrided and grinded.  Continue to use Penlac.  Return in 3 months or as needed.

## 2014-01-29 ENCOUNTER — Encounter: Payer: Self-pay | Admitting: Podiatry

## 2014-01-29 ENCOUNTER — Ambulatory Visit (INDEPENDENT_AMBULATORY_CARE_PROVIDER_SITE_OTHER): Payer: Medicaid Other | Admitting: Podiatry

## 2014-01-29 VITALS — BP 118/66 | HR 96

## 2014-01-29 DIAGNOSIS — L603 Nail dystrophy: Secondary | ICD-10-CM

## 2014-01-29 DIAGNOSIS — M79606 Pain in leg, unspecified: Secondary | ICD-10-CM

## 2014-01-29 DIAGNOSIS — B351 Tinea unguium: Secondary | ICD-10-CM

## 2014-01-29 NOTE — Patient Instructions (Signed)
Seen for hypertrophic nails. All nails debrided. Return in 3 months or as needed.  

## 2014-01-29 NOTE — Progress Notes (Signed)
Subjective:  24 year old male presents with care taker for follow up on bad toe nails on both great toes. He is none verbal and none communicable.  Care taker has been using Penlac daily as instructed for his bad nails. She noted of improvement.   Objective:  Dermatologic: Thick discolored dystrophic nails on both great toes.  No soft tissue infection or inflammation noted.  Neurovascular status are within normal.  No gross osseous deformities noted.   Assessment: Painful dystrophic nails both great toes.   Plan: Both big toe nails debrided.  Continue to use Penlac.  Return in 3 months or as needed.

## 2014-01-31 ENCOUNTER — Ambulatory Visit: Payer: Medicaid Other | Admitting: Podiatry

## 2014-04-07 ENCOUNTER — Telehealth: Payer: Self-pay | Admitting: *Deleted

## 2014-04-07 NOTE — Telephone Encounter (Signed)
04/07/14 [facility] [phone] and they are asking for refill on Generic Penlac for Patient.

## 2014-04-08 MED ORDER — CICLOPIROX 8 % EX SOLN: Freq: Every day | CUTANEOUS | Status: DC

## 2014-04-30 ENCOUNTER — Encounter: Payer: Self-pay | Admitting: Podiatry

## 2014-04-30 ENCOUNTER — Ambulatory Visit (INDEPENDENT_AMBULATORY_CARE_PROVIDER_SITE_OTHER): Payer: Medicaid Other | Admitting: Podiatry

## 2014-04-30 VITALS — BP 107/64 | HR 79

## 2014-04-30 DIAGNOSIS — M79606 Pain in leg, unspecified: Secondary | ICD-10-CM

## 2014-04-30 DIAGNOSIS — L603 Nail dystrophy: Secondary | ICD-10-CM

## 2014-04-30 DIAGNOSIS — B351 Tinea unguium: Secondary | ICD-10-CM | POA: Diagnosis not present

## 2014-04-30 NOTE — Patient Instructions (Signed)
Seen for hypertrophic nails. All nails debrided. Return in 3 months or as needed.  

## 2014-04-30 NOTE — Progress Notes (Signed)
Subjective:  24 year old male presents with care taker for follow up on mycotic both great toe nails.  He is none verbal and none communicable.  Care taker has been using Penlac daily as instructed for his bad nails.   Objective:  Dermatologic: Thick discolored dystrophic nail with build up residue from Penlac treatment on both great toes. All other nails are mildly hypertrophic. No soft tissue infection or inflammation noted.  Neurovascular status are within normal.  No gross osseous deformities noted.   Assessment: Painful dystrophic nails both great toes showing slow improvement with Penlac treatment.   Plan: Both big toe nails debrided.  Continue with Penlac.  Return in 3 months or as needed.

## 2014-07-30 ENCOUNTER — Ambulatory Visit (INDEPENDENT_AMBULATORY_CARE_PROVIDER_SITE_OTHER): Payer: Medicaid Other | Admitting: Podiatry

## 2014-07-30 ENCOUNTER — Encounter: Payer: Self-pay | Admitting: Podiatry

## 2014-07-30 VITALS — BP 120/74 | HR 102

## 2014-07-30 DIAGNOSIS — B351 Tinea unguium: Secondary | ICD-10-CM | POA: Diagnosis not present

## 2014-07-30 DIAGNOSIS — L603 Nail dystrophy: Secondary | ICD-10-CM

## 2014-07-30 DIAGNOSIS — M79606 Pain in leg, unspecified: Secondary | ICD-10-CM | POA: Diagnosis not present

## 2014-07-30 NOTE — Patient Instructions (Signed)
Seen for hypertrophic nails. All nails debrided. Return in 3 months or as needed.  

## 2014-07-30 NOTE — Progress Notes (Signed)
Subjective:  24 year old male presents with care taker for follow up on bad toe nails on both great toes. He is none verbal and none communicable.  Been using Penlac daily as instructed for his bad nails.   Objective:  Dermatologic: Thick discolored dystrophic nails on both great toes.  No soft tissue infection or inflammation noted.  Neurovascular status are within normal.  No gross osseous deformities noted.   Assessment: Painful dystrophic nails both great toes.   Plan: Both big toe nails debrided.  Continue to use Penlac.  Return in 3 months or as needed.

## 2014-08-08 ENCOUNTER — Encounter (HOSPITAL_COMMUNITY): Payer: Self-pay | Admitting: Emergency Medicine

## 2014-08-08 ENCOUNTER — Emergency Department (HOSPITAL_COMMUNITY)
Admission: EM | Admit: 2014-08-08 | Discharge: 2014-08-09 | Disposition: A | Discharge status: Home / Self Care | Payer: Medicaid Other | Attending: Emergency Medicine | Admitting: Emergency Medicine

## 2014-08-08 DIAGNOSIS — Z792 Long term (current) use of antibiotics: Secondary | ICD-10-CM | POA: Insufficient documentation

## 2014-08-08 DIAGNOSIS — F909 Attention-deficit hyperactivity disorder, unspecified type: Secondary | ICD-10-CM | POA: Diagnosis not present

## 2014-08-08 DIAGNOSIS — T63481A Toxic effect of venom of other arthropod, accidental (unintentional), initial encounter: Secondary | ICD-10-CM | POA: Insufficient documentation

## 2014-08-08 DIAGNOSIS — Z79899 Other long term (current) drug therapy: Secondary | ICD-10-CM | POA: Insufficient documentation

## 2014-08-08 DIAGNOSIS — Y998 Other external cause status: Secondary | ICD-10-CM | POA: Diagnosis not present

## 2014-08-08 DIAGNOSIS — Y92009 Unspecified place in unspecified non-institutional (private) residence as the place of occurrence of the external cause: Secondary | ICD-10-CM | POA: Diagnosis not present

## 2014-08-08 DIAGNOSIS — Y9389 Activity, other specified: Secondary | ICD-10-CM | POA: Insufficient documentation

## 2014-08-08 DIAGNOSIS — F840 Autistic disorder: Secondary | ICD-10-CM | POA: Diagnosis not present

## 2014-08-08 DIAGNOSIS — L989 Disorder of the skin and subcutaneous tissue, unspecified: Secondary | ICD-10-CM | POA: Diagnosis present

## 2014-08-08 LAB — CBC WITH DIFFERENTIAL/PLATELET
BASOS PCT: 0 % (ref 0–1)
Basophils Absolute: 0 10*3/uL (ref 0.0–0.1)
Eosinophils Absolute: 0.1 10*3/uL (ref 0.0–0.7)
Eosinophils Relative: 1 % (ref 0–5)
HCT: 41.8 % (ref 39.0–52.0)
Hemoglobin: 14.1 g/dL (ref 13.0–17.0)
Lymphocytes Relative: 33 % (ref 12–46)
Lymphs Abs: 2.1 10*3/uL (ref 0.7–4.0)
MCH: 30.5 pg (ref 26.0–34.0)
MCHC: 33.7 g/dL (ref 30.0–36.0)
MCV: 90.3 fL (ref 78.0–100.0)
Monocytes Absolute: 0.6 10*3/uL (ref 0.1–1.0)
Monocytes Relative: 10 % (ref 3–12)
Neutro Abs: 3.5 10*3/uL (ref 1.7–7.7)
Neutrophils Relative %: 56 % (ref 43–77)
Platelets: 214 10*3/uL (ref 150–400)
RBC: 4.63 MIL/uL (ref 4.22–5.81)
RDW: 12.3 % (ref 11.5–15.5)
WBC: 6.3 10*3/uL (ref 4.0–10.5)

## 2014-08-08 LAB — COMPREHENSIVE METABOLIC PANEL
ALT: 10 U/L — AB (ref 17–63)
ANION GAP: 6 (ref 5–15)
AST: 22 U/L (ref 15–41)
Albumin: 4.4 g/dL (ref 3.5–5.0)
Alkaline Phosphatase: 106 U/L (ref 38–126)
BUN: 10 mg/dL (ref 6–20)
CHLORIDE: 105 mmol/L (ref 101–111)
CO2: 26 mmol/L (ref 22–32)
Calcium: 9.1 mg/dL (ref 8.9–10.3)
Creatinine, Ser: 0.83 mg/dL (ref 0.61–1.24)
GFR calc Af Amer: >60 mL/min (ref >60)
GFR calc non Af Amer: >60 mL/min (ref >60)
GLUCOSE: 101 mg/dL — AB (ref 65–99)
Potassium: 4 mmol/L (ref 3.5–5.1)
Sodium: 137 mmol/L (ref 135–145)
TOTAL PROTEIN: 7.8 g/dL (ref 6.5–8.1)
Total Bilirubin: 0.6 mg/dL (ref 0.3–1.2)

## 2014-08-08 MED ORDER — DIPHENHYDRAMINE HCL 25 MG PO CAPS
50.0000 mg | ORAL_CAPSULE | Freq: Once | ORAL | Status: AC
  Administered 2014-08-08: 50 mg via ORAL
  Filled 2014-08-08: qty 2

## 2014-08-08 MED ORDER — FAMOTIDINE 20 MG PO TABS
20.0000 mg | ORAL_TABLET | Freq: Once | ORAL | Status: AC
  Administered 2014-08-08: 20 mg via ORAL
  Filled 2014-08-08: qty 1

## 2014-08-08 NOTE — ED Provider Notes (Signed)
CSN: [id number]     Arrival date & time 08/08/14  1754 History   First MD Initiated Contact with Patient 08/08/14 2134     Chief Complaint  Patient presents with  . Skin Problem    possible bug bite, redness to L wrist, streaking up arm.      (Consider location/radiation/quality/duration/timing/severity/associated sxs/prior Treatment) The history is provided by the patient.   [name] is a 24 y.o. male who presents for evaluation of worsening redness and swelling of the left wrist. He was at a daycare today when a counselor, noticed a red area on his left palm. Later, at his group home, they noticed that there was increased swelling on his left forearm. He was not treated with anything specific for it. There is no specific known trauma, but it is suspected that he was "bit by a bug". There has been no fever, chills, vomiting or change in his behavior.   Past Medical History  Diagnosis Date  . Mental retardation   . Autism   . ADHD (attention deficit hyperactivity disorder)    History reviewed. No pertinent past surgical history. No family history on file. History  Substance Use Topics  . Smoking status: Never Smoker   . Smokeless tobacco: Never Used  . Alcohol Use: No    Review of Systems  All other systems reviewed and are negative.     Allergies  Review of patient's allergies indicates no known allergies.  Home Medications   Prior to Admission medications   Medication Sig Start Date End Date Taking? Authorizing Provider  acetaminophen (TYLENOL) 160 MG/5ML liquid Take 15.6 mLs (500 mg total) by mouth every 6 (six) hours as needed for pain. 04/18/13  Yes Kaitlyn Szekalski, PA-C  benzonatate (TESSALON) 100 MG capsule Take 100 mg by mouth 2 (two) times daily.   Yes Historical Provider, MD  cetirizine (ZYRTEC) 10 MG tablet Take 10 mg by mouth daily.    Yes Historical Provider, MD  ciclopirox (PENLAC) 8 % solution Apply topically at bedtime. Apply over nail and  surrounding skin. Apply daily over previous coat. After seven (7) days, may remove with alcohol and continue cycle. 04/08/14  Yes Myeong O Sheard, DPM  clindamycin-benzoyl peroxide (BENZACLIN) gel Apply 1 application topically 2 (two) times daily.    Yes Historical Provider, MD  cloNIDine (CATAPRES) 0.1 MG tablet Take 0.1 mg by mouth at bedtime.     Yes Historical Provider, MD  omeprazole (PRILOSEC) 20 MG capsule Take 20 mg by mouth 2 (two) times daily before a meal.   Yes Historical Provider, MD  risperiDONE (RISPERDAL) 1 MG tablet Take 1 mg by mouth 3 (three) times daily.     Yes Historical Provider, MD   BP 113/71 mmHg  Pulse 85  Temp(Src) 97.9 F (36.6 C) (Oral)  Resp 16  SpO2 97% Physical Exam  Constitutional: He is oriented to person, place, and time. He appears well-developed and well-nourished.  HENT:  Head: Normocephalic and atraumatic.  Right Ear: External ear normal.  Left Ear: External ear normal.  Eyes: Conjunctivae and EOM are normal. Pupils are equal, round, and reactive to light.  Neck: Normal range of motion and phonation normal. Neck supple.  Cardiovascular: Normal rate.   Pulmonary/Chest: Breath sounds normal. He exhibits no bony tenderness.  Musculoskeletal: Normal range of motion.  Normal range of motion of both arms.  Neurological: He is alert and oriented to person, place, and time. No cranial nerve deficit or sensory deficit. He exhibits normal  muscle tone. Coordination normal.  Skin: Skin is warm, dry and intact.  Left basilar palm with a quarter size area of dark red, associated with this is a letter reddened area which extends into the proximal third of the forearm, in a triangular fashion. There is no suggestion for lymphangitic streaking. There is no tenderness, swelling or foreign body associated with this skin change. There is no associated vesicles or petechiae.  Psychiatric: He has a normal mood and affect. His behavior is normal. Judgment and thought content  normal.  Nursing note and vitals reviewed.   ED Course  Procedures (including critical care time)  Medications  famotidine (PEPCID) tablet 20 mg (20 mg Oral Given 08/08/14 2148)  diphenhydrAMINE (BENADRYL) capsule 50 mg (50 mg Oral Given 08/08/14 2148)    Patient Vitals for the past 24 hrs:  BP Temp Temp src Pulse Resp SpO2  08/08/14 1839 113/71 mmHg 97.9 F (36.6 C) Oral 85 16 97 %    10:57 PM Reevaluation with update and discussion. After initial assessment and treatment, an updated evaluation reveals redness is less pronounced at this time. Findings discussed with caregivers, all questions answered.Mancel Bale L    Labs Review Labs Reviewed  COMPREHENSIVE METABOLIC PANEL - Abnormal; Notable for the following:    Glucose, Bld 101 (*)    ALT 10 (*)    All other components within normal limits  CBC WITH DIFFERENTIAL/PLATELET    Imaging Review No results found.   EKG Interpretation None      MDM   Final diagnoses:  Insect sting, accidental or unintentional, initial encounter    Evaluation consistent with insect sting and local allergic reaction. Doubt severe envenomation, anaphylaxis, or impending vascular collapse.   Nursing Notes Reviewed/ Care Coordinated Applicable Imaging Reviewed Interpretation of Laboratory Data incorporated into ED treatment  The patient appears reasonably screened and/or stabilized for discharge and I doubt any other medical condition or other [facility] requiring further screening, evaluation, or treatment in the ED at this time prior to discharge.  Plan: Home Medications- H1/H2 blocker for 5 days; Home Treatments- rest; return here if the recommended treatment, does not improve the symptoms; Recommended follow up- PCP prn     Mancel Bale, MD 08/08/14 2258

## 2014-08-08 NOTE — ED Notes (Signed)
Pt is from a group home with caregivers at bedside.  Pt is nonverbal at baseline.  Caregiver reports pt was at a indoor summer camp today and she was told "he got bit by something".  Caregiver reports pt had small area of redness to L wrist.  Caregiver sts she put ice on it "but it never looked like a bug bite".  Caregiver reports over the next 1-2 hours "the redness started going up his arm".  Redness noted at this time spanning the width of pt's wrist and streaking approx 3-4" up into forearm.  Skin otherwise PWD.  Per caregiver, no other changes or complaints and pt is otherwise at baseline.  RR even/un-lab.  MAEI.  Ambulatory with steady gait.  NAD.

## 2014-08-08 NOTE — Discharge Instructions (Signed)
Continue the treatment with Pepcid 20 mg twice a day and Benadryl 25 mg 4 times a day, for 5 days. See your doctor as needed for problems.  Insect Bite Mosquitoes, flies, fleas, bedbugs, and many other insects can bite. Insect bites are different from insect stings. A sting is when venom is injected into the skin. Some insect bites can transmit infectious diseases. SYMPTOMS  Insect bites usually turn red, swell, and itch for 2 to 4 days. They often go away on their own. TREATMENT  Your caregiver may prescribe antibiotic medicines if a bacterial infection develops in the bite. HOME CARE INSTRUCTIONS  Do not scratch the bite area.  Keep the bite area clean and dry. Wash the bite area thoroughly with soap and water.  Put ice or cool compresses on the bite area.  Put ice in a plastic bag.  Place a towel between your skin and the bag.  Leave the ice on for 20 minutes, 4 times a day for the first 2 to 3 days, or as directed.  You may apply a baking soda paste, cortisone cream, or calamine lotion to the bite area as directed by your caregiver. This can help reduce itching and swelling.  Only take over-the-counter or prescription medicines as directed by your caregiver.  If you are given antibiotics, take them as directed. Finish them even if you start to feel better. You may need a tetanus shot if:  You cannot remember when you had your last tetanus shot.  You have never had a tetanus shot.  The injury broke your skin. If you get a tetanus shot, your arm may swell, get red, and feel warm to the touch. This is common and not a problem. If you need a tetanus shot and you choose not to have one, there is a rare chance of getting tetanus. Sickness from tetanus can be serious. SEEK IMMEDIATE MEDICAL CARE IF:   You have increased pain, redness, or swelling in the bite area.  You see a red line on the skin coming from the bite.  You have a fever.  You have joint pain.  You have a  headache or neck pain.  You have unusual weakness.  You have a rash.  You have chest pain or shortness of breath.  You have abdominal pain, nausea, or vomiting.  You feel unusually tired or sleepy. MAKE SURE YOU:   Understand these instructions.  Will watch your condition.  Will get help right away if you are not doing well or get worse. Document Released: 01/28/2004 Document Revised: 03/14/2011 Document Reviewed: 07/21/2010 [facility] Patient Information 2015 [place], [place]. This information is not intended to replace advice given to you by your health care provider. Make sure you discuss any questions you have with your health care provider.

## 2014-10-29 ENCOUNTER — Encounter: Payer: Self-pay | Admitting: Podiatry

## 2014-10-29 ENCOUNTER — Ambulatory Visit (INDEPENDENT_AMBULATORY_CARE_PROVIDER_SITE_OTHER): Payer: Medicaid Other | Admitting: Podiatry

## 2014-10-29 VITALS — BP 132/62 | HR 89

## 2014-10-29 DIAGNOSIS — L603 Nail dystrophy: Secondary | ICD-10-CM | POA: Diagnosis not present

## 2014-10-29 DIAGNOSIS — B351 Tinea unguium: Secondary | ICD-10-CM | POA: Diagnosis not present

## 2014-10-29 DIAGNOSIS — M79606 Pain in leg, unspecified: Secondary | ICD-10-CM

## 2014-10-29 NOTE — Patient Instructions (Signed)
Seen for mycotic nails. Both great toe nails still thick and mycotic. All nails debrided. Continue with daily penlac application to all affected nails. Return in 3 months.

## 2014-10-29 NOTE — Progress Notes (Signed)
Subjective:  24 year old male presents with care taker for follow up on bad toe nails on both great toes.  He is none verbal and none communicable.  Been using Penlac daily as instructed for his bad nails.   Objective:  Dermatologic: Thick discolored dystrophic nails on both great toes.  No soft tissue infection or inflammation noted.  Neurovascular status are within normal.  No gross osseous deformities noted.   Assessment: Painful dystrophic nails both great toes.   Plan: All nails debrided.  Continue to use Penlac.  Return in 3 months or as needed.

## 2015-01-25 IMAGING — CR DG CHEST 2V
2 series · 2 of 2 positions shown · non-contrast
Comparison: 12/30/2012.

CLINICAL DATA: Cough.  Pneumonia.

EXAM:
CHEST  2 VIEW

[view not recorded (1 of 2)]
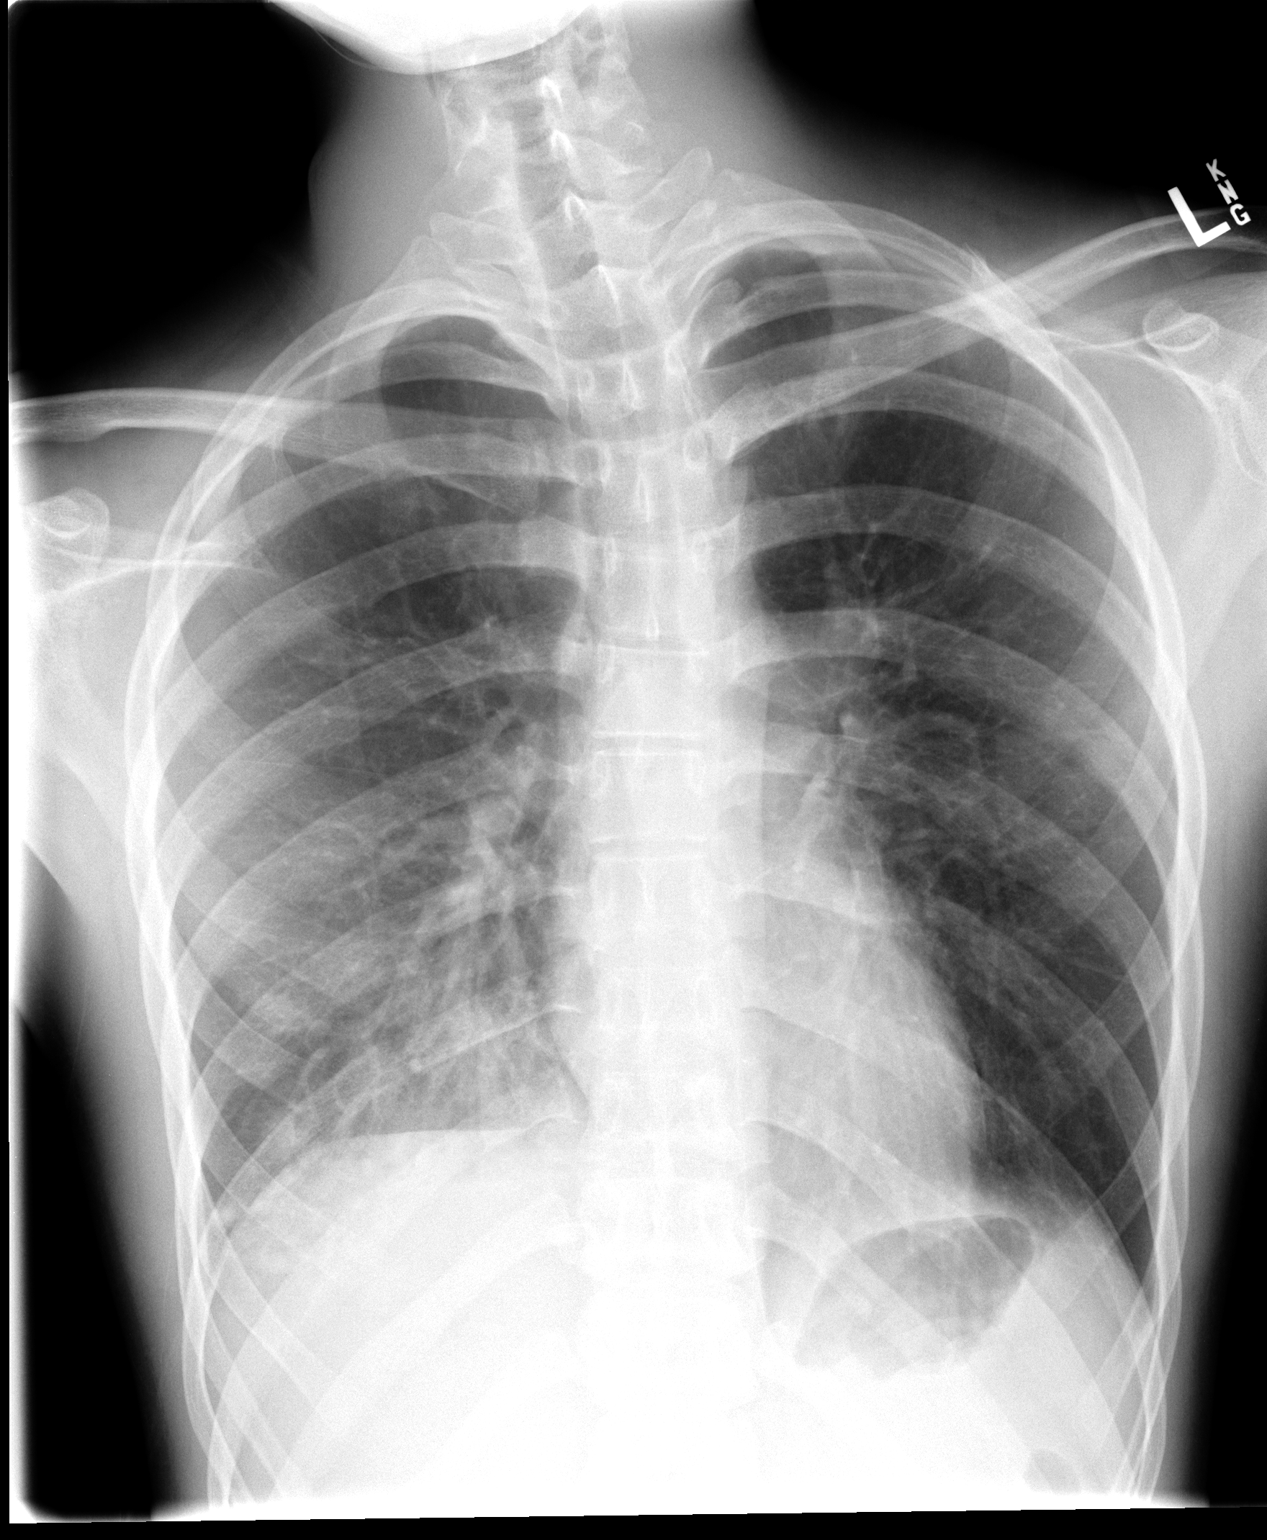

[view not recorded (2 of 2)]
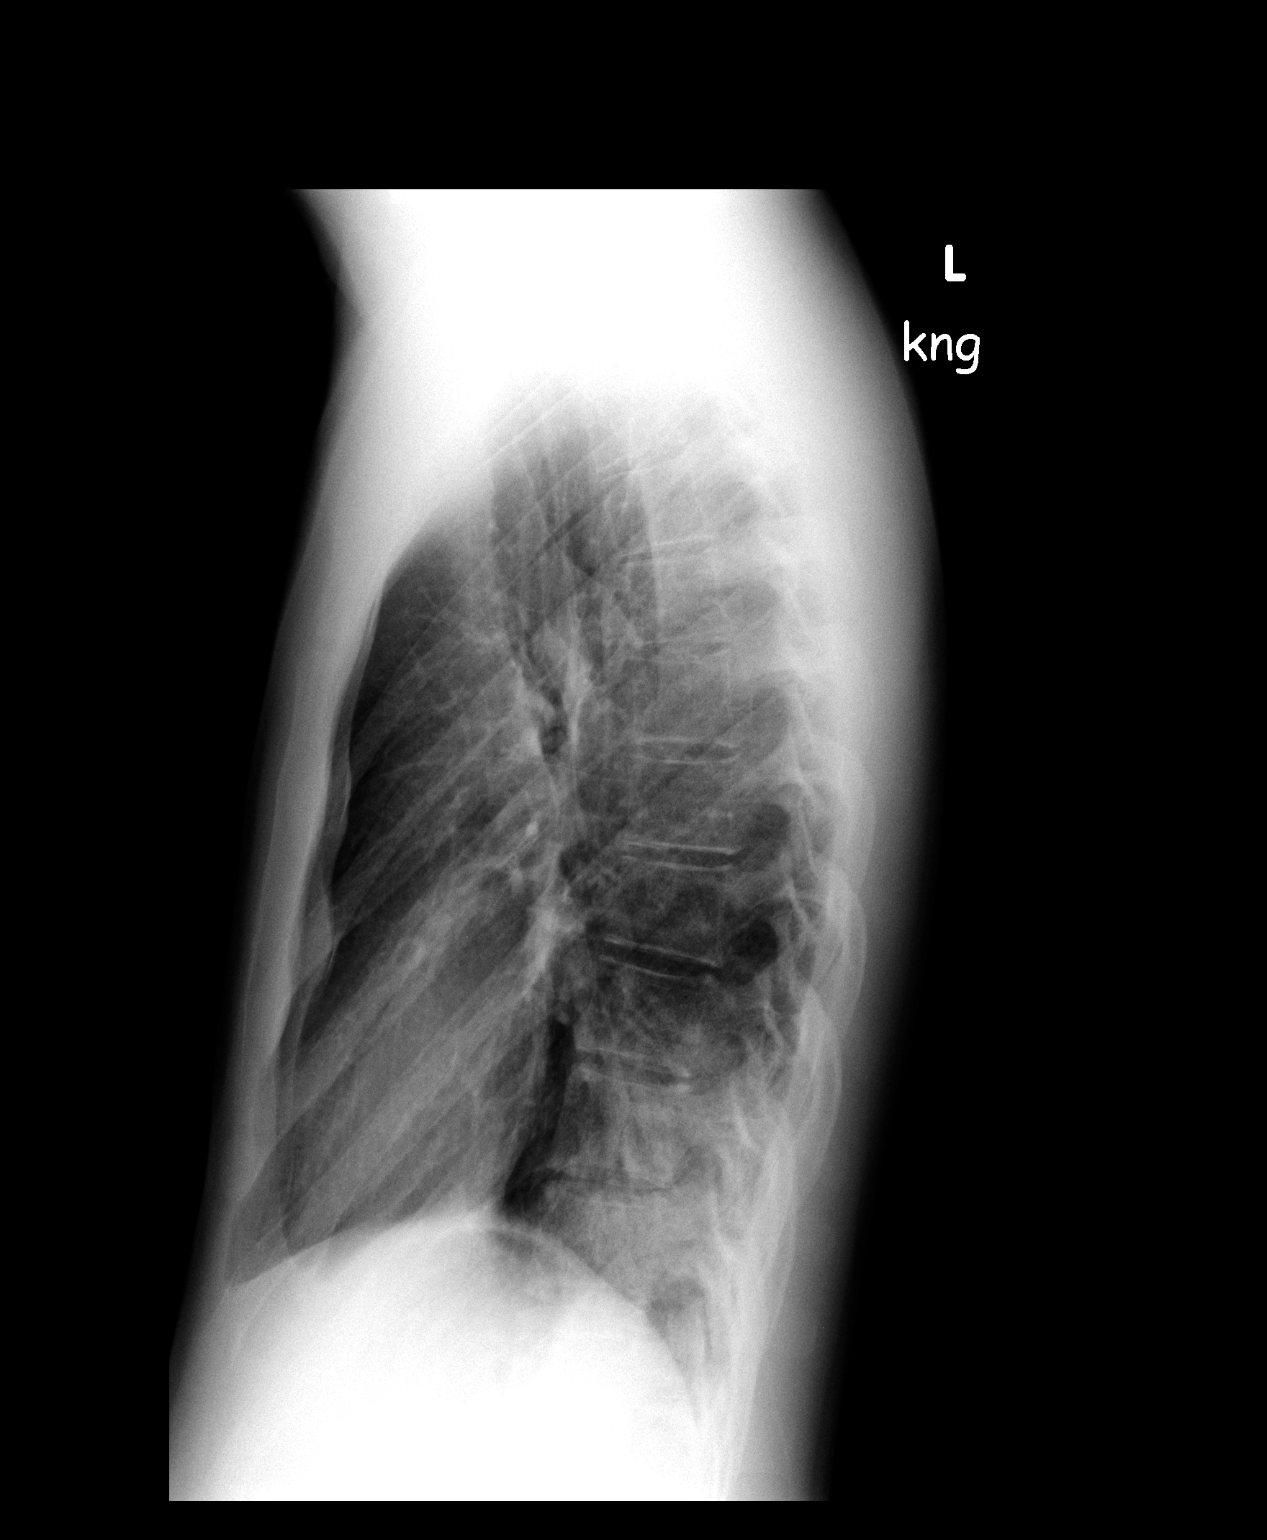

[2 of 2 positions shown; findings below may reference images not displayed]

FINDINGS: Prominent right lower lobe pneumonia noted on today's examination.
Infiltrates progressed from prior study. Heart is unremarkable.
Normal pulmonary vascularity. Tiny right pleural effusion cannot be
excluded. No pneumothorax.
IMPRESSION: Progressive right lower lobe pneumonia.

## 2015-01-29 ENCOUNTER — Ambulatory Visit (INDEPENDENT_AMBULATORY_CARE_PROVIDER_SITE_OTHER): Payer: Medicaid Other | Admitting: Podiatry

## 2015-01-29 ENCOUNTER — Encounter: Payer: Self-pay | Admitting: Podiatry

## 2015-01-29 VITALS — BP 124/75 | HR 94

## 2015-01-29 DIAGNOSIS — B351 Tinea unguium: Secondary | ICD-10-CM | POA: Diagnosis not present

## 2015-01-29 DIAGNOSIS — M79606 Pain in leg, unspecified: Secondary | ICD-10-CM | POA: Diagnosis not present

## 2015-01-29 NOTE — Progress Notes (Signed)
Subjective:  25 year old male presents with care taker for follow up on bad toe nails on both great toes.  He is none verbal and none communicable.  Been using Penlac daily as instructed for his bad nails.   Objective:  Dermatologic: Thick discolored dystrophic nails on both great toes.  No soft tissue infection or inflammation noted.  Neurovascular status are within normal.  No gross osseous deformities noted.   Assessment: Painful dystrophic nails both great toes.   Plan: All nails debrided.  Both great toe nails grinded down. Continue to use Penlac.  Return in 3 months or as needed.

## 2015-01-29 NOTE — Patient Instructions (Signed)
Seen for hypertrophic nails. All nails debrided. Continue with Penlac. Return in 3 months or as needed.

## 2015-03-23 ENCOUNTER — Ambulatory Visit: Payer: Medicaid Other | Attending: Internal Medicine | Admitting: Rehabilitative and Restorative Service Providers"

## 2015-03-23 DIAGNOSIS — R269 Unspecified abnormalities of gait and mobility: Secondary | ICD-10-CM | POA: Diagnosis present

## 2015-03-23 NOTE — Therapy (Signed)
[facility] [address] [place], [place], [address] Phone: [phone]   Fax:  [phone]  Physical Therapy Treatment  Patient Details  Name: [name] MRN: [id number] Date of Birth: [birth year] Referring Provider: Fleet Contras  Encounter Date: 03/23/2015      PT End of Session - 03/23/15 1335    Visit Number 1   Number of Visits 1   Date for PT Re-Evaluation 03/23/15   Authorization Type n/a- one time evaluation   PT Start Time 1232   PT Stop Time 1303   PT Time Calculation (min) 31 min   Activity Tolerance Other (comment)  cognitive limitations for commands in PT eval   Behavior During Therapy --  needs prompting to participate      Past Medical History  Diagnosis Date  . Mental retardation   . Autism   . ADHD (attention deficit hyperactivity disorder)     No past surgical history on file.  There were no vitals filed for this visit.  Visit Diagnosis:  Abnormality of gait      Subjective Assessment - 03/23/15 1233    Subjective The patient is accompanied by a caregiver from group home who is reporting that [name] does not walk well when out of the group home.  She used a weighted vest at times in school and that tends to help with compliance with walking.  His caregiver reports that his behaviors make it hard to take him out of the home.  [name] is getting exercise each day through swimming.     Patient is accompained by: --  caregiver   Patient Stated Goals Caregiver would like some help to calm [name] in busy environments in order to improve [name]'s safety during outings.   Currently in Pain? No/denies            [facility] PT Assessment - 03/23/15 1301    Assessment   Medical Diagnosis autism, ADHD, major depressive disorder, behavior disorder,   Referring Provider Fleet Contras   Onset Date/Surgical Date --  chronic deficits   Prior Therapy therapy in school   Precautions    Precautions Fall  Behavior precautions   Balance Screen   Has the patient fallen in the past 6 months Yes   How many times? many due to behavioral issues   Is the patient reluctant to leave their home because of a fear of falling?  Yes  caregiver reluctant to take patient out of home   Home Environment   Living Environment Group home   Additional Comments [facility]   Prior Function   Level of Independence Needs assistance with ADLs;Needs assistance with gait;Needs assistance with transfers   ROM / Strength   AROM / PROM / Strength AROM;Strength   AROM   Overall AROM  Unable to assess;Due to impaired cognition   Strength   Overall Strength Unable to assess;Due to impaired cognition   Overall Strength Comments Appears functional as patient is able to stand from a chair with UE support independently.   Transfers   Transfers Sit to Stand;Stand to Sit   Sit to Stand 6: Modified independent (Device/Increase time)   Stand to Sit 6: Modified independent (Device/Increase time)   Ambulation/Gait   Ambulation/Gait Yes   Gait Comments Patient ambulates with verbal and demonstration prompting participation with gait assessment.  His gait is characterized by decreased bilateral heel strike during gait activities and hypotonia noted in trunk.  PT Education - 03/23/15 1334    Education provided Yes   Education Details educated patient's caregiver on recommendations.   Person(s) Educated Agricultural engineerCaregiver(s)   Methods Explanation   Comprehension Verbalized understanding           Plan - 03/23/15 1336    Clinical Impression Statement Evaluation completed today for gait.  Issues with gait safety appear to be behavioral in nature per caregiver's descriptions.  The patient's caregiver is able to discuss strategies that have worked in the past including a weighted vest.  PT wrote Letter of Medical Necessity to support and plans to f/u with [facility] case manager.   Pt will  benefit from skilled therapeutic intervention in order to improve on the following deficits Abnormal gait   PT Next Visit Plan One time eval   Consulted and Agree with Plan of Care Patient;Family member/caregiver   Family Member Consulted caregiver        Problem List Patient Active Problem List   Diagnosis Date Noted  . Onychomycosis 10/29/2014  . Dystrophic nail 04/24/2013  . Pain in lower limb 04/24/2013  . Autism   . ADHD (attention deficit hyperactivity disorder)     PROVIDED CAREGIVER WITH THIS LETTER TO ASSIST FOR OBTAINING WEIGHTED VEST:  CAP Case Manager Kathreen Devoidatty Martin, case manager [phone]  03/23/2015 To Whom It May Concern: [name] (DOB 12-Jul-1990) was evaluated by physical therapy on 03/23/2015 due to concern for his safety when out of his [facility] environment.  [name] is able to ambulate, but in busy environments tends to sit down, lie on the ground, kick, refuse to walk, and at times run away from caregivers.  He has utilized a weighted sensory vest in a school environment in the past for sensory integration.  This appeared to help with calming, soothing [name] to reduce agitation and improve behaviors. During PT evaluation, [name] is able to ambulate independently on level surfaces.  He does not follow commands for isolated motor control tasks.  His caregiver's goal for attending this session was to improve [name]'s safety out of his home environment. After evaluating [name], a weighted vest would be indicated due to this helping in the past.  His caregiver knows him well as she has cared for [name] x 11 years.  A weighted vest will hopefully reduce agitation when in the community. Please let me know if you have any further questions regarding this recommendation.  Sincerely,  Margretta Dittyhristina Mariellen Blaney, MPT  Avry Roedl, PT 03/23/2015, 1:45 PM  [facility] [address] [place], [place], [address] Phone: [phone]   Fax:  [phone]  Name: Joel StarrCharlie Russell MRN: [id number] Date of Birth: 12-Jul-1990

## 2015-04-29 ENCOUNTER — Encounter: Payer: Self-pay | Admitting: Podiatry

## 2015-04-29 ENCOUNTER — Ambulatory Visit (INDEPENDENT_AMBULATORY_CARE_PROVIDER_SITE_OTHER): Payer: Medicaid Other | Admitting: Podiatry

## 2015-04-29 VITALS — BP 112/61 | HR 79 | Wt 140.0 lb

## 2015-04-29 DIAGNOSIS — B351 Tinea unguium: Secondary | ICD-10-CM | POA: Diagnosis not present

## 2015-04-29 DIAGNOSIS — L603 Nail dystrophy: Secondary | ICD-10-CM | POA: Diagnosis not present

## 2015-04-29 DIAGNOSIS — M79606 Pain in leg, unspecified: Secondary | ICD-10-CM | POA: Diagnosis not present

## 2015-04-29 NOTE — Patient Instructions (Signed)
Seen for hypertrophic nails. All nails debrided. Return in 3 months or as needed.  

## 2015-04-29 NOTE — Progress Notes (Signed)
Subjective:  25 year old Autistic male presents with care taker for follow up on bad toe nails on both great toes.  He is none verbal and none communicable.  Been using Penlac daily as instructed for his bad nails.   Objective:  Dermatologic: Thick discolored dystrophic nails on both great toes.  No soft tissue infection or inflammation noted.  Neurovascular status are within normal.  No gross osseous deformities noted.   Assessment: Painful dystrophic nails both great toes.   Plan: All nails debrided.  Both great toe nails grinded down. Continue to use Penlac.  Return in 3 months or as needed.

## 2015-07-29 ENCOUNTER — Ambulatory Visit: Payer: Medicaid Other | Admitting: Podiatry

## 2015-08-04 ENCOUNTER — Ambulatory Visit (INDEPENDENT_AMBULATORY_CARE_PROVIDER_SITE_OTHER): Payer: Medicaid Other | Admitting: Podiatry

## 2015-08-04 ENCOUNTER — Encounter: Payer: Self-pay | Admitting: Podiatry

## 2015-08-04 DIAGNOSIS — M79606 Pain in leg, unspecified: Secondary | ICD-10-CM

## 2015-08-04 DIAGNOSIS — M79673 Pain in unspecified foot: Secondary | ICD-10-CM

## 2015-08-04 DIAGNOSIS — B351 Tinea unguium: Secondary | ICD-10-CM

## 2015-08-04 DIAGNOSIS — L603 Nail dystrophy: Secondary | ICD-10-CM

## 2015-08-04 NOTE — Progress Notes (Signed)
Subjective:  25 year old Autistic male presents with care taker for follow up on bad toe nails on both great toes.  He is none verbal and none communicable.  Been using Penlac daily as instructed for his bad nails.   Objective:  Dermatologic: Thick discolored dystrophic nails on both great toes.  No soft tissue infection or inflammation noted.  Neurovascular status are within normal.  No gross osseous deformities noted.   Assessment: Painful dystrophic nails both great toes.   Plan: All nails debrided.  Continue to use Penlac.  Return in 3 months or as needed.

## 2015-08-04 NOTE — Patient Instructions (Signed)
Seen for hypertrophic nails. All nails debrided. Continue with Penlac application on both great toes daily. Return in 3 months or as needed.

## 2015-11-04 ENCOUNTER — Encounter: Payer: Self-pay | Admitting: Podiatry

## 2015-11-04 ENCOUNTER — Ambulatory Visit (INDEPENDENT_AMBULATORY_CARE_PROVIDER_SITE_OTHER): Payer: Medicaid Other | Admitting: Podiatry

## 2015-11-04 VITALS — BP 119/70 | HR 80

## 2015-11-04 DIAGNOSIS — M79672 Pain in left foot: Secondary | ICD-10-CM | POA: Diagnosis not present

## 2015-11-04 DIAGNOSIS — L603 Nail dystrophy: Secondary | ICD-10-CM

## 2015-11-04 DIAGNOSIS — B351 Tinea unguium: Secondary | ICD-10-CM

## 2015-11-04 DIAGNOSIS — M79671 Pain in right foot: Secondary | ICD-10-CM | POA: Diagnosis not present

## 2015-11-04 NOTE — Progress Notes (Signed)
Subjective: 25 year old Autistic male presents with care taker for follow up on bad toe nails on both great toes.  He is none verbal and none communicable. Still using Penlac daily as instructed for thick fungal nails on both great toes.  Objective: Dermatologic: Thick discolored dystrophic nails on both great toes.  No soft tissue infection or inflammation noted.  Neurovascular status are within normal.  No gross osseous deformities noted.   Assessment: Painful dystrophic nails both great toes.   Plan: All nails debrided.  Continue to use Penlac.  Return in 3 months or as needed.

## 2015-11-04 NOTE — Patient Instructions (Signed)
Seen for hypertrophic nails. All nails debrided. Return in 3 months or as needed.  

## 2016-02-04 ENCOUNTER — Ambulatory Visit: Payer: Medicaid Other | Admitting: Podiatry

## 2016-02-11 ENCOUNTER — Ambulatory Visit: Payer: Medicaid Other | Admitting: Podiatry

## 2016-02-15 ENCOUNTER — Ambulatory Visit (INDEPENDENT_AMBULATORY_CARE_PROVIDER_SITE_OTHER): Payer: Medicaid Other | Admitting: Podiatry

## 2016-02-15 ENCOUNTER — Encounter: Payer: Self-pay | Admitting: Podiatry

## 2016-02-15 VITALS — BP 111/72 | HR 71

## 2016-02-15 DIAGNOSIS — B351 Tinea unguium: Secondary | ICD-10-CM | POA: Diagnosis not present

## 2016-02-15 DIAGNOSIS — L603 Nail dystrophy: Secondary | ICD-10-CM

## 2016-02-15 NOTE — Patient Instructions (Signed)
Seen for hypertrophic nails. All nails debrided. Continue with Penlac. Return in 3 months or as needed.

## 2016-02-15 NOTE — Progress Notes (Signed)
Subjective: 26 year old Autistic male presents with care taker for follow up on bad toe nails on both great toes.  Fungal nails are being treated with Penlac for over a year. He is none verbal and none communicable.   Objective: Thick discolored dystrophic nails on both great toes.  No soft tissue infection or inflammation noted.  Neurovascular status are within normal.  No gross osseous deformities noted.   Assessment: Painful dystrophic nails both great toes.   Plan: All nails debrided.  Continue to use Penlac.  Return in 3 months or as needed.

## 2016-05-12 ENCOUNTER — Ambulatory Visit: Payer: Medicaid Other | Admitting: Podiatry

## 2016-05-19 ENCOUNTER — Ambulatory Visit: Payer: Medicaid Other | Admitting: Podiatry

## 2016-05-25 ENCOUNTER — Ambulatory Visit (INDEPENDENT_AMBULATORY_CARE_PROVIDER_SITE_OTHER): Payer: Medicaid Other | Admitting: Podiatry

## 2016-05-25 DIAGNOSIS — M79671 Pain in right foot: Secondary | ICD-10-CM

## 2016-05-25 DIAGNOSIS — L603 Nail dystrophy: Secondary | ICD-10-CM

## 2016-05-25 DIAGNOSIS — B351 Tinea unguium: Secondary | ICD-10-CM

## 2016-05-25 DIAGNOSIS — M79672 Pain in left foot: Secondary | ICD-10-CM

## 2016-05-25 NOTE — Progress Notes (Signed)
Subjective: 26 year old Autistic male presents with care taker for follow up on bad toe nails on both great toes.  Fungal nails are being treated with Penlac for over a year. He is none verbal and none communicable.   Objective: Thick discolored dystrophic nails on both great toes.  No soft tissue infection or inflammation noted.  Neurovascular status are within normal.  No gross osseous deformities noted.   Assessment: Painful dystrophic nails both great toes.  Some improvement noted.   Plan: All nails debrided. Also grinded thickness down with electric grinder. Continue to use Penlac.  Return in 3 months or as needed.

## 2016-05-25 NOTE — Patient Instructions (Signed)
Seen for mycotic nails. All debrided. Continue with Penlac daily.  Noted of improving nail. Return in 3 month.

## 2016-05-26 ENCOUNTER — Encounter: Payer: Self-pay | Admitting: Podiatry

## 2016-08-25 ENCOUNTER — Ambulatory Visit: Payer: Medicaid Other | Admitting: Podiatry

## 2016-08-31 ENCOUNTER — Ambulatory Visit (INDEPENDENT_AMBULATORY_CARE_PROVIDER_SITE_OTHER): Payer: Medicaid Other | Admitting: Podiatry

## 2016-08-31 ENCOUNTER — Encounter: Payer: Self-pay | Admitting: Podiatry

## 2016-08-31 DIAGNOSIS — L603 Nail dystrophy: Secondary | ICD-10-CM

## 2016-08-31 DIAGNOSIS — B351 Tinea unguium: Secondary | ICD-10-CM | POA: Diagnosis not present

## 2016-08-31 DIAGNOSIS — M79672 Pain in left foot: Secondary | ICD-10-CM

## 2016-08-31 DIAGNOSIS — M79671 Pain in right foot: Secondary | ICD-10-CM

## 2016-08-31 NOTE — Patient Instructions (Signed)
Seen for hypertrophic fungal nails. All nails debrided. Continue with Penlac treatment. Return in 3 months or sooner if needed.

## 2016-08-31 NOTE — Progress Notes (Signed)
Subjective: 26year old Autistic male presents with care taker for follow up on bad toe nails on both great toes.  Fungal nails are being treated with Penlac for over a year. He is none verbal and none communicable.   Objective: Thick discolored dystrophic nails on both great toes.  No soft tissue infection or inflammation noted.  Neurovascular status are within normal.  No gross osseous deformities noted.   Assessment: Painful dystrophic nails both great toes.  Slow making improvement on fungal nails.  Plan: All nails debrided. Also grinded thickness down with electric grinder. Continue to use Penlac.  Return in 3 months or as needed.

## 2016-12-01 ENCOUNTER — Ambulatory Visit (INDEPENDENT_AMBULATORY_CARE_PROVIDER_SITE_OTHER): Payer: Medicaid Other | Admitting: Podiatry

## 2016-12-01 ENCOUNTER — Encounter: Payer: Self-pay | Admitting: Podiatry

## 2016-12-01 DIAGNOSIS — L603 Nail dystrophy: Secondary | ICD-10-CM

## 2016-12-01 DIAGNOSIS — M79671 Pain in right foot: Secondary | ICD-10-CM | POA: Diagnosis not present

## 2016-12-01 DIAGNOSIS — B351 Tinea unguium: Secondary | ICD-10-CM | POA: Diagnosis not present

## 2016-12-01 DIAGNOSIS — M79672 Pain in left foot: Secondary | ICD-10-CM | POA: Diagnosis not present

## 2016-12-01 NOTE — Patient Instructions (Signed)
Seen for hypertrophic nails. All nails debrided. Return in 3 months or as needed.  

## 2016-12-01 NOTE — Progress Notes (Signed)
Subjective: 26 y.o. year old male patient presents accompanied by his care taker for follow up on fungal nails. He is been treated with Penlac.  Patient is not compliant during the visit.  Objective: Dermatologic: Thick yellow deformed nails both great toes. Vascular: Pedal pulses are all palpable. Orthopedic: No growth deformities. Neurologic: All epicritic and tactile sensations grossly intact.  Assessment: Dystrophic mycotic nails both great toes.  Treatment: Patient was not cooperative today. All mycotic nails debrided. Both big toe nails grinded. Continue with Penlac treatment on both great toes. Return in 3 months or as needed.

## 2017-01-23 ENCOUNTER — Encounter (HOSPITAL_COMMUNITY): Payer: Self-pay | Admitting: Family Medicine

## 2017-01-23 ENCOUNTER — Emergency Department (HOSPITAL_COMMUNITY)
Admission: EM | Admit: 2017-01-23 | Discharge: 2017-01-23 | Disposition: A | Discharge status: Home / Self Care | Payer: Medicaid Other | Attending: Emergency Medicine | Admitting: Emergency Medicine

## 2017-01-23 DIAGNOSIS — T50905A Adverse effect of unspecified drugs, medicaments and biological substances, initial encounter: Secondary | ICD-10-CM | POA: Diagnosis not present

## 2017-01-23 DIAGNOSIS — E871 Hypo-osmolality and hyponatremia: Secondary | ICD-10-CM

## 2017-01-23 DIAGNOSIS — F79 Unspecified intellectual disabilities: Secondary | ICD-10-CM | POA: Insufficient documentation

## 2017-01-23 DIAGNOSIS — Z79899 Other long term (current) drug therapy: Secondary | ICD-10-CM | POA: Insufficient documentation

## 2017-01-23 DIAGNOSIS — F840 Autistic disorder: Secondary | ICD-10-CM | POA: Insufficient documentation

## 2017-01-23 DIAGNOSIS — R451 Restlessness and agitation: Secondary | ICD-10-CM | POA: Diagnosis present

## 2017-01-23 LAB — ACETAMINOPHEN LEVEL

## 2017-01-23 LAB — BASIC METABOLIC PANEL
Anion gap: 6 (ref 5–15)
BUN: 7 mg/dL (ref 6–20)
CHLORIDE: 98 mmol/L — AB (ref 101–111)
CO2: 26 mmol/L (ref 22–32)
Calcium: 9 mg/dL (ref 8.9–10.3)
Creatinine, Ser: 0.75 mg/dL (ref 0.61–1.24)
GFR calc non Af Amer: >60 mL/min (ref >60)
Glucose, Bld: 95 mg/dL (ref 65–99)
Potassium: 3.9 mmol/L (ref 3.5–5.1)
SODIUM: 130 mmol/L — AB (ref 135–145)

## 2017-01-23 LAB — CBC
HCT: 39.2 % (ref 39.0–52.0)
HEMOGLOBIN: 14.1 g/dL (ref 13.0–17.0)
MCH: 30.9 pg (ref 26.0–34.0)
MCHC: 36 g/dL (ref 30.0–36.0)
MCV: 86 fL (ref 78.0–100.0)
Platelets: 274 10*3/uL (ref 150–400)
RBC: 4.56 MIL/uL (ref 4.22–5.81)
RDW: 11.9 % (ref 11.5–15.5)
WBC: 6.4 10*3/uL (ref 4.0–10.5)

## 2017-01-23 LAB — SALICYLATE LEVEL: Salicylate Lvl: <7 mg/dL (ref 2.8–30.0)

## 2017-01-23 MED ORDER — STERILE WATER FOR INJECTION IJ SOLN
INTRAMUSCULAR | Status: AC
  Administered 2017-01-23: 2.1 mL
  Filled 2017-01-23: qty 10

## 2017-01-23 MED ORDER — LORAZEPAM 2 MG/ML IJ SOLN
1.0000 mg | Freq: Once | INTRAMUSCULAR | Status: AC
  Administered 2017-01-23: 1 mg via INTRAMUSCULAR
  Filled 2017-01-23: qty 1

## 2017-01-23 MED ORDER — ZIPRASIDONE MESYLATE 20 MG IM SOLR
10.0000 mg | Freq: Once | INTRAMUSCULAR | Status: AC
  Administered 2017-01-23: 10 mg via INTRAMUSCULAR

## 2017-01-23 MED ORDER — ZIPRASIDONE MESYLATE 20 MG IM SOLR
INTRAMUSCULAR | Status: AC
  Filled 2017-01-23: qty 20

## 2017-01-23 NOTE — ED Triage Notes (Signed)
On January 7th, patient had a Genecept Assay 2.0 test. On January 9th, Dr. A. changed medications: increased Clonazepam and Hydroxyzine, discontinued Celexa, Zyprexa, and Clonidine, and patient was started on Latuda and Desvealafexine ER. Also, patients behavior has changed since the change in medication. Patient has had decreased in sleep and being aggressive to everyone including his caretaker. Patients care giver has called Dr. Mervyn SkeetersA on Wednesday, Thursday, and Friday. Patient has an appointment tomorrow at 8:30am with Dr. Mervyn SkeetersA.

## 2017-01-23 NOTE — ED Notes (Signed)
Security has been notified that patient needs to be wanded before transported to room 30.

## 2017-01-23 NOTE — ED Notes (Signed)
Patient was brought in by his Legal Guardian, Asa SaunasStacey Fuller, for aggressive behavior related to change in medication. Patient walked into the lobby, started throwing items in the lobby, lying on the floor, screaming. Security and off duty officer went to the lobby. Dr. Patria Maneampos was notified by staff and went to the lobby. Patient was brought from the lobby to triage by him in a wheelchair with security, off duty officer, and Dr. Patria Maneampos protecting his safety. Dr. Patria Maneampos immediately placed medication orders to help calm patient for further evaluation. Patient was being very aggressive. When administering medication, he did calm down to take medication.

## 2017-01-23 NOTE — Discharge Instructions (Signed)
Please follow-up with Dr. Jannifer FranklinAkintayo as scheduled tomorrow.  Please follow-up with the patient's primary care physician about patient's low sodium today.  Please return to the ER sooner if the patient experiences any continued agitation, or has any new or worsening symptoms.

## 2017-01-23 NOTE — ED Notes (Signed)
Gave report to Donnal Debarandi, Charity fundraiserN for [facility] for room [facility]. After triage, spoke with Dr. Patria Maneampos about treatment plan for patient. He states he will not be over his care and came to assist with emergency management of his care. Will hold on orders until he is evaluated by another provider.

## 2017-01-23 NOTE — ED Notes (Signed)
Bed: WA30 Expected date:  Expected time:  Means of arrival:  Comments: 

## 2017-01-23 NOTE — BH Assessment (Signed)
[facility] Assessment Progress Note This Clinical research associatewriter contacted [name] to staff case with Akintayo recommending patient be discharged and he will see in the a.m. This Clinical research associatewriter will contact EDP to facilitate discharge.
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
## 2017-01-23 NOTE — BH Assessment (Addendum)
Assessment Note  [name] is an 27 y.o. male that presents this date with foster mother [name] [phone]. Patient is currently receiving services from [facility] who assists with medication management. Patient's guardian is his mother [name] although patient is currently in a day program through [employer] and presents this date with his foster mother. [name] mother reports that patient has had some recent medication changes (01/11/17) that she feels has increased patient's level of aggression. Per note review, patient was seen by  Joel Franklin Russell on January 7th. On January 9th, Dr. Jannifer Russell. changed medications: increased Clonazepam and Hydroxyzine, discontinued Celexa, Zyprexa, and Clonidine, and patient was started on Latuda and Desvealafexine ER. Also, patients behavior has changed since the change in medication. Patient has had decreased in sleep and being aggressive to everyone including his caretaker. Patients care giver has called Dr. Jannifer Russell on Wednesday, Thursday, and Friday. Patient has an appointment tomorrow at 8:30am with Dr. Jannifer Russell.  Patient was brought in by his foster mother, [name], for aggressive behavior related to change in medication. Patient walked into the lobby, started throwing items in the lobby, lying on the floor, screaming. Security and off duty officer went to the lobby. Dr. Patria Russell was notified by staff and went to the lobby. Patient was brought from the lobby to triage by him in a wheelchair with security, off duty officer, and Dr. Patria Russell protecting his safety. Dr. Patria Russell immediately placed medication orders to help calm patient for further evaluation. Patient was being very aggressive. When administering medication, he did calm down to take medication. Patient is observed to be calmer at the time the writer conducted assessment. Foster mother requests since patient has received medications, that he be discharged to her care. Patient has a  appointment with [name] in the a.m. This Clinical research associate contacted [name] to staff case with Joel recommending patient be discharged and he will see in the a.m. This Clinical research associate will contact EDP to facilitate discharge.    Diagnosis: ADHD, MR (per notes)  Past Medical History:  Past Medical History:  Diagnosis Date  . ADHD (attention deficit hyperactivity disorder)   . Autism   . Mental retardation     History reviewed. No pertinent surgical history.  Family History: History reviewed. No pertinent family history.  Social History:  reports that  has never smoked. he has never used smokeless tobacco. He reports that he does not drink alcohol or use drugs.  Additional Social History:  Alcohol / Drug Use Pain Medications: See MAR Prescriptions: See MAR Over the Counter: See MAR History of alcohol / drug use?: No history of alcohol / drug abuse Longest period of sobriety (when/how long): NA Negative Consequences of Use: (NA) Withdrawal Symptoms: (NA)  CIWA: CIWA-Ar BP: 118/80 Pulse Rate: 81 COWS:    Allergies: No Known Allergies  Home Medications:  (Not in a hospital admission)  OB/GYN Status:  No LMP for male patient.  General Assessment Data Location of Assessment: [facility] TTS Assessment: In system Is this a Tele or Face-to-Face Assessment?: Face-to-Face Is this an Initial Assessment or a Re-assessment for this encounter?: Initial Assessment Marital status: Single Maiden name: NA Is patient pregnant?: No Pregnancy Status: No Living Arrangements: Other (Comment)(Foster Parent [name] [phone]) Can pt return to current living arrangement?: Yes Admission Status: Voluntary Is patient capable of signing voluntary admission?: No Referral Source: Other(Foster Parent ) Insurance type: Medicade  Medical Screening Exam [facility] Walk-in ONLY) Medical Exam completed: Yes  Crisis Care  Plan Living Arrangements: Other (Comment)(Foster Parent [name]  [phone]) Legal Guardian: Other:([name]) Name of Psychiatrist: Akintayo Russell Name of Therapist: None  Education Status Is patient currently in school?: No Current Grade: (NA) Highest grade of school patient has completed: (NA) Name of school: (NA) Contact person: (NA)  Risk to self with the past 6 months Suicidal Ideation: No Has patient been a risk to self within the past 6 months prior to admission? : No Suicidal Intent: No Has patient had any suicidal intent within the past 6 months prior to admission? : No Is patient at risk for suicide?: No Suicidal Plan?: No Has patient had any suicidal plan within the past 6 months prior to admission? : No Access to Means: No What has been your use of drugs/alcohol within the last 12 months?: Denies Previous Attempts/Gestures: No How many times?: 0 Other Self Harm Risks: (NA) Triggers for Past Attempts: Unknown Intentional Self Injurious Behavior: (Scratching) Family Suicide History: No Recent stressful life event(s): (Med mang) Persecutory voices/beliefs?: No Depression: No Depression Symptoms: (NA) Substance abuse history and/or treatment for substance abuse?: No Suicide prevention information given to non-admitted patients: Not applicable  Risk to Others within the past 6 months Homicidal Ideation: No Does patient have any lifetime risk of violence toward others beyond the six months prior to admission? : No Thoughts of Harm to Others: No Current Homicidal Intent: No Current Homicidal Plan: No Access to Homicidal Means: No Identified Victim: NA History of harm to others?: No Assessment of Violence: None Noted Violent Behavior Description: NA Does patient have access to weapons?: No Criminal Charges Pending?: No Does patient have a court date: No Is patient on probation?: No  Psychosis Hallucinations: None noted Delusions: None noted  Mental Status Report Appearance/Hygiene: In scrubs Eye Contact: Unable to  Assess Motor Activity: Agitation Speech: Unable to assess Level of Consciousness: Drowsy Mood: (UTA) Affect: (UTA) Anxiety Level: Minimal Thought Processes: (UTA) Judgement: (UTA) Orientation: Unable to assess Obsessive Compulsive Thoughts/Behaviors: Unable to Assess  Cognitive Functioning Concentration: Unable to Assess Memory: Unable to Assess IQ: (UTA) Insight: Unable to Assess Impulse Control: Unable to Assess Appetite: (UTA) Weight Loss: (UTA) Weight Gain: (UTA) Sleep: (UTA) Total Hours of Sleep: (UTA) Vegetative Symptoms: (UTA)  ADLScreening [facility] Assessment Services) Patient's cognitive ability adequate to safely complete daily activities?: No Patient able to express need for assistance with ADLs?: No Independently performs ADLs?: No  Prior Inpatient Therapy Prior Inpatient Therapy: No Prior Therapy Dates: NA Prior Therapy Facilty/Provider(s): NA Reason for Treatment: NA  Prior Outpatient Therapy Prior Outpatient Therapy: Yes Prior Therapy Dates: Ongoing Prior Therapy Facilty/Provider(s): [name] Reason for Treatment: Med mang Does patient have an ACCT team?: No Does patient have Intensive In-House Services?  : No Does patient have [facility] services? : No Does patient have P4CC services?: No  ADL Screening (condition at time of admission) Patient's cognitive ability adequate to safely complete daily activities?: No Is the patient deaf or have difficulty hearing?: No Does the patient have difficulty seeing, even when wearing glasses/contacts?: No Does the patient have difficulty concentrating, remembering, or making decisions?: Yes Patient able to express need for assistance with ADLs?: No Does the patient have difficulty dressing or bathing?: Yes Independently performs ADLs?: No Communication: Needs assistance Is this a change from baseline?: Pre-admission baseline Dressing (OT): Needs assistance Is this a change from baseline?: Pre-admission  baseline Grooming: Needs assistance Is this a change from baseline?: Pre-admission baseline Feeding: Needs assistance Is this a change from baseline?:  Pre-admission baseline Bathing: Needs assistance Is this a change from baseline?: Pre-admission baseline Toileting: Needs assistance Is this a change from baseline?: Pre-admission baseline In/Out Bed: Independent Walks in Home: Independent Does the patient have difficulty walking or climbing stairs?: No Weakness of Legs: None Weakness of Arms/Hands: None  Home Assistive Devices/Equipment Home Assistive Devices/Equipment: None  Therapy Consults (therapy consults require a physician order) PT Evaluation Needed: No OT Evalulation Needed: No SLP Evaluation Needed: No Abuse/Neglect Assessment (Assessment to be complete while patient is alone) Physical Abuse: Denies Verbal Abuse: Denies Sexual Abuse: Denies Exploitation of patient/patient's resources: Denies Self-Neglect: Denies Values / Beliefs Cultural Requests During Hospitalization: None Spiritual Requests During Hospitalization: None Consults Spiritual Care Consult Needed: No Social Work Consult Needed: No Merchant navy officerAdvance Directives (For Healthcare) Does Patient Have a Medical Advance Directive?: No Would patient like information on creating a medical advance directive?: No - Patient declined    Additional Information 1:1 In Past 12 Months?: No CIRT Risk: Yes Elopement Risk: Yes Does patient have medical clearance?: Yes     Disposition: This Clinical research associatewriter contacted [name] to staff case with Joel recommending patient be discharged and he will see in the a.m. This Clinical research associatewriter will contact EDP to facilitate discharge.  Disposition Initial Assessment Completed for this Encounter: Yes Disposition of Patient: Other dispositions Other disposition(s): Other (Comment)(Pt will be discharged this date)  On Site Evaluation by:   Reviewed with Physician:    Alfredia Fergusonavid L Hudsyn Barich 01/23/2017  6:27 PM

## 2017-01-24 ENCOUNTER — Emergency Department (HOSPITAL_COMMUNITY)
Admission: EM | Admit: 2017-01-24 | Discharge: 2017-01-30 | Disposition: A | Discharge status: Home / Self Care | Payer: Medicaid Other | Attending: Emergency Medicine | Admitting: Emergency Medicine

## 2017-01-24 ENCOUNTER — Other Ambulatory Visit: Payer: Self-pay

## 2017-01-24 ENCOUNTER — Encounter (HOSPITAL_COMMUNITY): Payer: Self-pay | Admitting: Nurse Practitioner

## 2017-01-24 DIAGNOSIS — R413 Other amnesia: Secondary | ICD-10-CM | POA: Diagnosis not present

## 2017-01-24 DIAGNOSIS — Z79899 Other long term (current) drug therapy: Secondary | ICD-10-CM | POA: Diagnosis not present

## 2017-01-24 DIAGNOSIS — F79 Unspecified intellectual disabilities: Secondary | ICD-10-CM | POA: Diagnosis not present

## 2017-01-24 DIAGNOSIS — R450 Nervousness: Secondary | ICD-10-CM | POA: Diagnosis not present

## 2017-01-24 DIAGNOSIS — F840 Autistic disorder: Secondary | ICD-10-CM | POA: Insufficient documentation

## 2017-01-24 DIAGNOSIS — F900 Attention-deficit hyperactivity disorder, predominantly inattentive type: Secondary | ICD-10-CM | POA: Diagnosis not present

## 2017-01-24 DIAGNOSIS — F909 Attention-deficit hyperactivity disorder, unspecified type: Secondary | ICD-10-CM | POA: Diagnosis present

## 2017-01-24 DIAGNOSIS — R451 Restlessness and agitation: Secondary | ICD-10-CM | POA: Diagnosis not present

## 2017-01-24 DIAGNOSIS — R4587 Impulsiveness: Secondary | ICD-10-CM | POA: Diagnosis not present

## 2017-01-24 DIAGNOSIS — F419 Anxiety disorder, unspecified: Secondary | ICD-10-CM | POA: Diagnosis not present

## 2017-01-24 DIAGNOSIS — Z046 Encounter for general psychiatric examination, requested by authority: Secondary | ICD-10-CM | POA: Diagnosis present

## 2017-01-24 LAB — COMPREHENSIVE METABOLIC PANEL
ALBUMIN: 4.9 g/dL (ref 3.5–5.0)
ALK PHOS: 119 U/L (ref 38–126)
ALT: 11 U/L — AB (ref 17–63)
AST: 42 U/L — ABNORMAL HIGH (ref 15–41)
Anion gap: 9 (ref 5–15)
BILIRUBIN TOTAL: 0.3 mg/dL (ref 0.3–1.2)
BUN: 9 mg/dL (ref 6–20)
CALCIUM: 9.4 mg/dL (ref 8.9–10.3)
CO2: 25 mmol/L (ref 22–32)
CREATININE: 0.86 mg/dL (ref 0.61–1.24)
Chloride: 96 mmol/L — ABNORMAL LOW (ref 101–111)
GFR calc Af Amer: >60 mL/min (ref >60)
GFR calc non Af Amer: >60 mL/min (ref >60)
GLUCOSE: 95 mg/dL (ref 65–99)
Potassium: 4 mmol/L (ref 3.5–5.1)
SODIUM: 130 mmol/L — AB (ref 135–145)
Total Protein: 8.3 g/dL — ABNORMAL HIGH (ref 6.5–8.1)

## 2017-01-24 LAB — CBC WITH DIFFERENTIAL/PLATELET
Basophils Absolute: 0 10*3/uL (ref 0.0–0.1)
Basophils Relative: 0 %
EOS PCT: 0 %
Eosinophils Absolute: 0 10*3/uL (ref 0.0–0.7)
HCT: 40.3 % (ref 39.0–52.0)
Hemoglobin: 14.3 g/dL (ref 13.0–17.0)
LYMPHS PCT: 25 %
Lymphs Abs: 1.8 10*3/uL (ref 0.7–4.0)
MCH: 30.6 pg (ref 26.0–34.0)
MCHC: 35.5 g/dL (ref 30.0–36.0)
MCV: 86.1 fL (ref 78.0–100.0)
Monocytes Absolute: 0.5 10*3/uL (ref 0.1–1.0)
Monocytes Relative: 7 %
Neutro Abs: 4.9 10*3/uL (ref 1.7–7.7)
Neutrophils Relative %: 68 %
Platelets: 292 10*3/uL (ref 150–400)
RBC: 4.68 MIL/uL (ref 4.22–5.81)
RDW: 12.1 % (ref 11.5–15.5)
WBC: 7.2 10*3/uL (ref 4.0–10.5)

## 2017-01-24 LAB — SALICYLATE LEVEL: Salicylate Lvl: <7 mg/dL (ref 2.8–30.0)

## 2017-01-24 LAB — ACETAMINOPHEN LEVEL: Acetaminophen (Tylenol), Serum: <10 ug/mL — ABNORMAL LOW (ref 10–30)

## 2017-01-24 LAB — ETHANOL: Alcohol, Ethyl (B): <10 mg/dL (ref <10)

## 2017-01-24 MED ORDER — ZIPRASIDONE HCL 20 MG PO CAPS
60.0000 mg | ORAL_CAPSULE | Freq: Two times a day (BID) | ORAL | Status: DC
  Administered 2017-01-24 – 2017-01-26 (×5): 60 mg via ORAL
  Filled 2017-01-24 (×7): qty 3

## 2017-01-24 MED ORDER — LORATADINE 10 MG PO TABS
10.0000 mg | ORAL_TABLET | Freq: Every day | ORAL | Status: DC
  Administered 2017-01-24 – 2017-01-30 (×5): 10 mg via ORAL
  Filled 2017-01-24 (×7): qty 1

## 2017-01-24 MED ORDER — LORAZEPAM 1 MG PO TABS
1.0000 mg | ORAL_TABLET | Freq: Three times a day (TID) | ORAL | Status: DC
  Administered 2017-01-24 – 2017-01-30 (×11): 1 mg via ORAL
  Filled 2017-01-24 (×16): qty 1

## 2017-01-24 MED ORDER — FLUTICASONE PROPIONATE 50 MCG/ACT NA SUSP
1.0000 | Freq: Every day | NASAL | Status: DC
  Administered 2017-01-24 – 2017-01-30 (×5): 2 via NASAL
  Filled 2017-01-24: qty 16

## 2017-01-24 MED ORDER — HYDROXYZINE HCL 25 MG PO TABS
25.0000 mg | ORAL_TABLET | Freq: Three times a day (TID) | ORAL | Status: DC
  Administered 2017-01-24 – 2017-01-26 (×7): 25 mg via ORAL
  Filled 2017-01-24 (×9): qty 1

## 2017-01-24 MED ORDER — BENZONATATE 100 MG PO CAPS
100.0000 mg | ORAL_CAPSULE | Freq: Two times a day (BID) | ORAL | Status: DC
  Administered 2017-01-24 – 2017-01-30 (×8): 100 mg via ORAL
  Filled 2017-01-24 (×11): qty 1

## 2017-01-24 MED ORDER — CLONAZEPAM 1 MG PO TABS: 1.0000 mg | ORAL_TABLET | Freq: Three times a day (TID) | ORAL | Status: DC

## 2017-01-24 MED ORDER — PANTOPRAZOLE SODIUM 40 MG PO TBEC
40.0000 mg | DELAYED_RELEASE_TABLET | Freq: Every day | ORAL | Status: DC
  Administered 2017-01-24 – 2017-01-30 (×5): 40 mg via ORAL
  Filled 2017-01-24 (×5): qty 1

## 2017-01-24 MED ORDER — POLYETHYLENE GLYCOL 3350 17 GM/SCOOP PO POWD
17.0000 g | Freq: Every day | ORAL | Status: DC
  Filled 2017-01-24: qty 255

## 2017-01-24 MED ORDER — CLINDAMYCIN PHOS-BENZOYL PEROX 1-5 % EX GEL
1.0000 "application " | Freq: Two times a day (BID) | CUTANEOUS | Status: DC
  Administered 2017-01-25: 1 via TOPICAL

## 2017-01-24 MED ORDER — CICLOPIROX 8 % EX SOLN
Freq: Every day | CUTANEOUS | Status: DC
  Filled 2017-01-24 (×6): qty 1

## 2017-01-24 MED ORDER — POLYETHYLENE GLYCOL 3350 17 G PO PACK
17.0000 g | PACK | Freq: Every day | ORAL | Status: DC
  Administered 2017-01-24 – 2017-01-30 (×5): 17 g via ORAL
  Filled 2017-01-24 (×7): qty 1
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
## 2017-01-24 NOTE — ED Notes (Signed)
Bed: WA31 Expected date:  Expected time:  Means of arrival:  Comments: 

## 2017-01-24 NOTE — BH Assessment (Addendum)
[facility] Assessment Progress Note  [name]'s EPIC record indicates that [name] has a medical power of attorney, which this Clinical research associatewriter has printed and reviewed.  The document indicates that [name]'s legal guardian is [name], and that the medical power of attorney is [name] (cell: [phone]).  The record does not, however, have a copy of the letter of guardianship.  At 14:20 I called Ms [name].  She reports that [name], at which time she had provided ED staff with both the medical power of attorney document as well as the letter of guardianship.  Nonetheless, she agrees to fax both the letter of guardianship and psychometric testing for the [name] to me.  I provided her with both my phone number and my fax number [phone]), as well as my office hours.  Ms [name] reports that [name] has a [facility], [name].  I will try to reach this individual.  [name], [place] Behavioral Health Coordinator [phone]   Addendum:  At 14:47 I called the [facility] [phone]).  They report that they do have a care coordinator by the name of [name] on staff, then forward me to her voice mail that identifies her at [name].  I left a voice message.  As of this writing, return call is pending.  [name], [place] Behavioral Health Coordinator [phone]
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
## 2017-01-25 DIAGNOSIS — F840 Autistic disorder: Secondary | ICD-10-CM | POA: Diagnosis not present

## 2017-01-25 DIAGNOSIS — R4587 Impulsiveness: Secondary | ICD-10-CM | POA: Diagnosis not present

## 2017-01-25 LAB — RAPID URINE DRUG SCREEN, HOSP PERFORMED
Amphetamines: NOT DETECTED
BARBITURATES: NOT DETECTED
BENZODIAZEPINES: POSITIVE — AB
COCAINE: NOT DETECTED
Opiates: NOT DETECTED
Tetrahydrocannabinol: NOT DETECTED

## 2017-01-25 MED ORDER — ZIPRASIDONE MESYLATE 20 MG IM SOLR
20.0000 mg | Freq: Once | INTRAMUSCULAR | Status: AC
  Administered 2017-01-25: 20 mg via INTRAMUSCULAR

## 2017-01-25 MED ORDER — STERILE WATER FOR INJECTION IJ SOLN
INTRAMUSCULAR | Status: AC
  Administered 2017-01-25: 1.2 mL
  Filled 2017-01-25: qty 10

## 2017-01-25 MED ORDER — ZIPRASIDONE MESYLATE 20 MG IM SOLR
INTRAMUSCULAR | Status: AC
  Administered 2017-01-25: 20 mg via INTRAMUSCULAR
  Filled 2017-01-25: qty 20

## 2017-01-25 MED ORDER — LORAZEPAM 1 MG PO TABS: 1.0000 mg | ORAL_TABLET | Freq: Once | ORAL | Status: DC

## 2017-01-25 MED ORDER — ZIPRASIDONE MESYLATE 20 MG IM SOLR
10.0000 mg | Freq: Once | INTRAMUSCULAR | Status: AC
  Administered 2017-01-25: 10 mg via INTRAMUSCULAR

## 2017-01-25 MED ORDER — STERILE WATER FOR INJECTION IJ SOLN
INTRAMUSCULAR | Status: AC
  Administered 2017-01-25: 1.5 mL
  Filled 2017-01-25: qty 10

## 2017-01-25 MED ORDER — ZIPRASIDONE MESYLATE 20 MG IM SOLR
INTRAMUSCULAR | Status: AC
  Filled 2017-01-25: qty 20

## 2017-01-25 NOTE — ED Notes (Signed)
Pt Vitals done
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
## 2017-01-25 NOTE — BH Assessment (Signed)
[facility] Assessment Progress Note  At 12:35 [facility] at [facility] reports that their IDD unit is at capacity.  Yesterday, 01/24/2017, [facility] reported that the IDD unit was at capacity.  Doylene Canninghomas Robbie Nangle, [place] Behavioral Health Coordinator [phone]

## 2017-01-25 NOTE — ED Notes (Signed)
Bed: WA20 Expected date:  Expected time:  Means of arrival:  Comments: Hold for TCU 31

## 2017-01-25 NOTE — ED Notes (Addendum)
Malen GauzeFoster mother- [phone]

## 2017-01-26 DIAGNOSIS — R4587 Impulsiveness: Secondary | ICD-10-CM | POA: Diagnosis not present

## 2017-01-26 DIAGNOSIS — F900 Attention-deficit hyperactivity disorder, predominantly inattentive type: Secondary | ICD-10-CM | POA: Diagnosis not present

## 2017-01-26 DIAGNOSIS — R451 Restlessness and agitation: Secondary | ICD-10-CM

## 2017-01-26 DIAGNOSIS — F840 Autistic disorder: Secondary | ICD-10-CM

## 2017-01-26 DIAGNOSIS — R450 Nervousness: Secondary | ICD-10-CM

## 2017-01-26 DIAGNOSIS — F419 Anxiety disorder, unspecified: Secondary | ICD-10-CM

## 2017-01-26 MED ORDER — CHLORPROMAZINE HCL 25 MG/ML IJ SOLN
25.0000 mg | Freq: Once | INTRAMUSCULAR | Status: AC
  Administered 2017-01-26: 25 mg via INTRAMUSCULAR
  Filled 2017-01-26: qty 1

## 2017-01-26 MED ORDER — DIPHENHYDRAMINE HCL 50 MG/ML IJ SOLN
25.0000 mg | Freq: Once | INTRAMUSCULAR | Status: AC
  Administered 2017-01-26: 25 mg via INTRAMUSCULAR
  Filled 2017-01-26: qty 1

## 2017-01-26 MED ORDER — STERILE WATER FOR INJECTION IJ SOLN
INTRAMUSCULAR | Status: AC
  Administered 2017-01-26: 13:00:00
  Filled 2017-01-26: qty 10

## 2017-01-26 MED ORDER — LORAZEPAM 1 MG PO TABS: 2.0000 mg | ORAL_TABLET | Freq: Once | ORAL | Status: AC

## 2017-01-26 MED ORDER — LORAZEPAM 2 MG/ML IJ SOLN
1.0000 mg | Freq: Once | INTRAMUSCULAR | Status: AC
Start: 2017-01-26 — End: 2017-01-26
  Administered 2017-01-26: 1 mg via INTRAMUSCULAR
  Filled 2017-01-26: qty 1

## 2017-01-26 MED ORDER — DIPHENHYDRAMINE HCL 50 MG/ML IJ SOLN
INTRAMUSCULAR | Status: AC
  Filled 2017-01-26: qty 1

## 2017-01-26 MED ORDER — DIPHENHYDRAMINE HCL 50 MG/ML IJ SOLN
50.0000 mg | Freq: Once | INTRAMUSCULAR | Status: AC
  Administered 2017-01-26: 50 mg via INTRAMUSCULAR

## 2017-01-26 MED ORDER — LORAZEPAM 2 MG/ML IJ SOLN
1.0000 mg | Freq: Once | INTRAMUSCULAR | Status: AC
  Administered 2017-01-26: 1 mg via INTRAMUSCULAR
  Filled 2017-01-26: qty 1

## 2017-01-26 MED ORDER — LORAZEPAM 2 MG/ML IJ SOLN
2.0000 mg | Freq: Once | INTRAMUSCULAR | Status: AC
  Administered 2017-01-26: 2 mg via INTRAMUSCULAR
  Filled 2017-01-26: qty 1

## 2017-01-26 MED ORDER — ZIPRASIDONE MESYLATE 20 MG IM SOLR
20.0000 mg | Freq: Once | INTRAMUSCULAR | Status: AC
  Administered 2017-01-26: 20 mg via INTRAMUSCULAR
  Filled 2017-01-26: qty 20

## 2017-01-26 MED ORDER — CHLORPROMAZINE HCL 25 MG/ML IJ SOLN: 25.0000 mg | Freq: Once | INTRAMUSCULAR | Status: DC

## 2017-01-26 MED ORDER — LORAZEPAM 2 MG/ML IJ SOLN
2.0000 mg | Freq: Once | INTRAMUSCULAR | Status: AC
  Administered 2017-01-26: 2 mg via INTRAMUSCULAR

## 2017-01-26 MED ORDER — HALOPERIDOL LACTATE 5 MG/ML IJ SOLN
5.0000 mg | Freq: Once | INTRAMUSCULAR | Status: AC
  Administered 2017-01-26: 5 mg via INTRAMUSCULAR

## 2017-01-26 MED ORDER — HALOPERIDOL 5 MG PO TABS: 5.0000 mg | ORAL_TABLET | Freq: Once | ORAL | Status: AC

## 2017-01-26 MED ORDER — HALOPERIDOL LACTATE 5 MG/ML IJ SOLN
INTRAMUSCULAR | Status: AC
  Filled 2017-01-26: qty 1

## 2017-01-26 MED ORDER — LORAZEPAM 2 MG/ML IJ SOLN
INTRAMUSCULAR | Status: AC
  Filled 2017-01-26: qty 1

## 2017-01-26 MED ORDER — DIPHENHYDRAMINE HCL 25 MG PO CAPS: 50.0000 mg | ORAL_CAPSULE | Freq: Once | ORAL | Status: AC

## 2017-01-26 NOTE — ED Notes (Signed)
Bed: [id number] Expected date:  Expected time:  Means of arrival:  Comments: Hold for 20

## 2017-01-26 NOTE — ED Notes (Signed)
Bed: WA28 Expected date:  Expected time:  Means of arrival:  Comments: Hold for 20 

## 2017-01-26 NOTE — ED Notes (Signed)
PA, Barbara CowerJason will put in orders for meds to be given one time now for aggitation/agression. [facility] is [facility] in [facility] as well.

## 2017-01-26 NOTE — ED Notes (Signed)
Bed: WA31 Expected date:  Expected time:  Means of arrival:  Comments: 

## 2017-01-26 NOTE — ED Notes (Addendum)
Joel GauzeFoster mother stated "I want to wait to see if he calms down before we give the tessalon pearl.  I also have the medicine I put on his face @ home and will bring it in the morning."
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
## 2017-01-26 NOTE — ED Notes (Signed)
Patient only ate a few bites of his breakfast. Patient ate 5 packs of crackers with peanut butter and drank two sprites for lunch.

## 2017-01-26 NOTE — Consult Note (Signed)
[facility] Consult   Reason for Consult:  Agitation  Referring Physician:  EDP Patient Identification: [name] MRN:  [id number] Principal Diagnosis: Autism Diagnosis:   Patient Active Problem List   Diagnosis Date Noted  . Autism [F84.0]     Priority: High  . ADHD (attention deficit hyperactivity disorder) [F90.9]     Priority: High  . Onychomycosis [B35.1] 10/29/2014  . Dystrophic nail [L60.3] 04/24/2013  . Pain in lower limb [M79.606] 04/24/2013    Total Time spent with patient: 30 minutes  Subjective:   [name] is a 27 y.o. male patient admitted with agitation.  HPI:  27 yo male who presented to the ED with agitation, more than his normal.  Last night he did not sleep well but calm this morning with his foster mother at his bedside.  He is calm when she is around but when she left became agitated and needed PRN medications.  Attempting to stabilize him so he can return to his day program.  [facility] has no beds.  Past Psychiatric History: agitation, autism, anxiety  Risk to Self: Suicidal Ideation: No Suicidal Intent: No Is patient at risk for suicide?: No Suicidal Plan?: No Access to Means: No Intentional Self Injurious Behavior: None Risk to Others: Homicidal Ideation: No Thoughts of Harm to Others: No Current Homicidal Intent: No Current Homicidal Plan: No Access to Homicidal Means: No History of harm to others?: Yes Assessment of Violence: On admission Violent Behavior Description: Pt has been having sudden aggressive bx Does patient have access to weapons?: No Criminal Charges Pending?: No Does patient have a court date: No Prior Inpatient Therapy: Prior Inpatient Therapy: No Prior Outpatient Therapy: Prior Outpatient Therapy: No Does patient have an ACCT team?: No Does patient have Intensive In-House Services?  : No Does patient have [facility] services? : No Does patient have P4CC services?: No  Past Medical History:  Past  Medical History:  Diagnosis Date  . ADHD (attention deficit hyperactivity disorder)   . Autism   . Mental retardation    History reviewed. No pertinent surgical history. Family History: History reviewed. No pertinent family history. Family Psychiatric  History: unknown Social History:  Social History   Substance and Sexual Activity  Alcohol Use No  . Alcohol/week: 0.0 oz     Social History   Substance and Sexual Activity  Drug Use No    Social History   Socioeconomic History  . Marital status: Single    Spouse name: None  . Number of children: None  . Years of education: None  . Highest education level: None  Social Needs  . Financial resource strain: None  . Food insecurity - worry: None  . Food insecurity - inability: None  . Transportation needs - medical: None  . Transportation needs - non-medical: None  Occupational History  . None  Tobacco Use  . Smoking status: Never Smoker  . Smokeless tobacco: Never Used  Substance and Sexual Activity  . Alcohol use: No    Alcohol/week: 0.0 oz  . Drug use: No  . Sexual activity: None  Other Topics Concern  . None  Social History Narrative  . None   Additional Social History: N/A    Allergies:  No Known Allergies  Labs:  Results for orders placed or performed during the hospital encounter of 01/24/17 (from the past 48 hour(s))  Ethanol     Status: None   Collection Time: 01/24/17  1:43 PM  Result Value Ref Range  Alcohol, Ethyl (B) <10 <10 mg/dL    Comment:        LOWEST DETECTABLE LIMIT FOR SERUM ALCOHOL IS 10 mg/dL FOR MEDICAL PURPOSES ONLY   Salicylate level     Status: None   Collection Time: 01/24/17  1:43 PM  Result Value Ref Range   Salicylate Lvl <1.0 2.8 - 30.0 mg/dL  Acetaminophen level     Status: Abnormal   Collection Time: 01/24/17  1:43 PM  Result Value Ref Range   Acetaminophen (Tylenol), Serum <10 (L) 10 - 30 ug/mL    Comment:        THERAPEUTIC CONCENTRATIONS VARY SIGNIFICANTLY. A  RANGE OF 10-30 ug/mL MAY BE AN EFFECTIVE CONCENTRATION FOR MANY PATIENTS. HOWEVER, SOME ARE BEST TREATED AT CONCENTRATIONS OUTSIDE THIS RANGE. ACETAMINOPHEN CONCENTRATIONS >150 ug/mL AT 4 HOURS AFTER INGESTION AND >50 ug/mL AT 12 HOURS AFTER INGESTION ARE OFTEN ASSOCIATED WITH TOXIC REACTIONS.   CBC with Differential/Platelet     Status: None   Collection Time: 01/24/17  1:43 PM  Result Value Ref Range   WBC 7.2 4.0 - 10.5 K/uL   RBC 4.68 4.22 - 5.81 MIL/uL   Hemoglobin 14.3 13.0 - 17.0 g/dL   HCT 40.3 39.0 - 52.0 %   MCV 86.1 78.0 - 100.0 fL   MCH 30.6 26.0 - 34.0 pg   MCHC 35.5 30.0 - 36.0 g/dL   RDW 12.1 11.5 - 15.5 %   Platelets 292 150 - 400 K/uL   Neutrophils Relative % 68 %   Neutro Abs 4.9 1.7 - 7.7 K/uL   Lymphocytes Relative 25 %   Lymphs Abs 1.8 0.7 - 4.0 K/uL   Monocytes Relative 7 %   Monocytes Absolute 0.5 0.1 - 1.0 K/uL   Eosinophils Relative 0 %   Eosinophils Absolute 0.0 0.0 - 0.7 K/uL   Basophils Relative 0 %   Basophils Absolute 0.0 0.0 - 0.1 K/uL  Comprehensive metabolic panel     Status: Abnormal   Collection Time: 01/24/17  1:43 PM  Result Value Ref Range   Sodium 130 (L) 135 - 145 mmol/L   Potassium 4.0 3.5 - 5.1 mmol/L   Chloride 96 (L) 101 - 111 mmol/L   CO2 25 22 - 32 mmol/L   Glucose, Bld 95 65 - 99 mg/dL   BUN 9 6 - 20 mg/dL   Creatinine, Ser 0.86 0.61 - 1.24 mg/dL   Calcium 9.4 8.9 - 10.3 mg/dL   Total Protein 8.3 (H) 6.5 - 8.1 g/dL   Albumin 4.9 3.5 - 5.0 g/dL   AST 42 (H) 15 - 41 U/L   ALT 11 (L) 17 - 63 U/L   Alkaline Phosphatase 119 38 - 126 U/L   Total Bilirubin 0.3 0.3 - 1.2 mg/dL   GFR calc non Af Amer >60 >60 mL/min   GFR calc Af Amer >60 >60 mL/min    Comment: (NOTE) The eGFR has been calculated using the CKD EPI equation. This calculation has not been validated in all clinical situations. eGFR's persistently <60 mL/min signify possible Chronic Kidney Disease.    Anion gap 9 5 - 15  Rapid urine drug screen (hospital  performed)     Status: Abnormal   Collection Time: 01/25/17  7:33 AM  Result Value Ref Range   Opiates NONE DETECTED NONE DETECTED   Cocaine NONE DETECTED NONE DETECTED   Benzodiazepines POSITIVE (A) NONE DETECTED   Amphetamines NONE DETECTED NONE DETECTED   Tetrahydrocannabinol NONE DETECTED NONE DETECTED  Barbiturates NONE DETECTED NONE DETECTED    Comment: (NOTE) DRUG SCREEN FOR MEDICAL PURPOSES ONLY.  IF CONFIRMATION IS NEEDED FOR ANY PURPOSE, NOTIFY LAB WITHIN 5 DAYS. LOWEST DETECTABLE LIMITS FOR URINE DRUG SCREEN Drug Class                     Cutoff (ng/mL) Amphetamine and metabolites    1000 Barbiturate and metabolites    200 Benzodiazepine                 270 Tricyclics and metabolites     300 Opiates and metabolites        300 Cocaine and metabolites        300 THC                            50     Current Facility-Administered Medications  Medication Dose Route Frequency Provider Last Rate Last Dose  . benzonatate (TESSALON) capsule 100 mg  100 mg Oral BID Lacretia Leigh, MD   100 mg at 01/26/17 3500  . ciclopirox (PENLAC) 8 % solution   Topical QHS Lacretia Leigh, MD      . clindamycin-benzoyl peroxide [facility]) gel 1 application  1 application Topical BID Lacretia Leigh, MD   1 application at 93/81/82 [phone]  . fluticasone (FLONASE) 50 MCG/ACT nasal spray 1-2 spray  1-2 spray Each Nare Daily Lacretia Leigh, MD   2 spray at 01/26/17 1696  . hydrOXYzine (ATARAX/VISTARIL) tablet 25 mg  25 mg Oral TID Ethelene Hal, NP   25 mg at 01/26/17 7893  . loratadine (CLARITIN) tablet 10 mg  10 mg Oral Daily Lacretia Leigh, MD   10 mg at 01/26/17 0925  . LORazepam (ATIVAN) tablet 1 mg  1 mg Oral TID Ethelene Hal, NP   1 mg at 01/26/17 0806  . LORazepam (ATIVAN) tablet 1 mg  1 mg Oral Once Faythe Dingwall, DO      . pantoprazole (PROTONIX) EC tablet 40 mg  40 mg Oral Daily Lacretia Leigh, MD   40 mg at 01/26/17 8101  . polyethylene glycol (MIRALAX /  GLYCOLAX) packet 17 g  17 g Oral Daily Lacretia Leigh, MD   17 g at 01/26/17 0925  . ziprasidone (GEODON) capsule 60 mg  60 mg Oral BID WC Ethelene Hal, NP   60 mg at 01/26/17 7510   Current Outpatient Medications  Medication Sig Dispense Refill  . benzonatate (TESSALON) 100 MG capsule Take 100 mg by mouth 2 (two) times daily.    . cetirizine (ZYRTEC) 10 MG tablet Take 10 mg by mouth daily.     . ciclopirox (PENLAC) 8 % solution Apply topically at bedtime. Apply over nail and surrounding skin. Apply daily over previous coat. After seven (7) days, may remove with alcohol and continue cycle. 6.6 mL 6  . clindamycin-benzoyl peroxide (BENZACLIN) gel Apply 1 application topically 2 (two) times daily.     . clonazePAM (KLONOPIN) 1 MG tablet Take 1 mg by mouth 3 (three) times daily.    Marland Kitchen desvenlafaxine (PRISTIQ) 50 MG 24 hr tablet Take 50 mg by mouth daily.    . fluticasone (FLONASE) 50 MCG/ACT nasal spray Place 1-2 sprays into both nostrils daily.    Marland Kitchen guanFACINE (TENEX) 2 MG tablet Take 2 mg by mouth 2 (two) times daily.    . hydrOXYzine (ATARAX/VISTARIL) 25 MG tablet Take 25 mg by mouth 3 (three) times  daily.    Marland Kitchen LORazepam (ATIVAN) 1 MG tablet Take 1 mg by mouth 3 (three) times daily.    . Lurasidone HCl (LATUDA) 60 MG TABS Take 60 mg by mouth at bedtime.    Marland Kitchen omeprazole (PRILOSEC) 20 MG capsule Take 20 mg by mouth 2 (two) times daily before a meal.    . oxcarbazepine (TRILEPTAL) 600 MG tablet Take 600 mg by mouth 2 (two) times daily.    . polyethylene glycol powder (GLYCOLAX/MIRALAX) powder Take 17 g by mouth daily.    . ziprasidone (GEODON) 60 MG capsule Take 60 mg by mouth 2 (two) times daily with a meal.    . acetaminophen (TYLENOL) 160 MG/5ML liquid Take 15.6 mLs (500 mg total) by mouth every 6 (six) hours as needed for pain. (Patient not taking: Reported on 01/24/2017) 237 mL 0    Musculoskeletal: Strength & Muscle Tone: within normal limits Gait & Station: normal Patient leans:  N/A  Psychiatric Specialty Exam: Physical Exam  Nursing note and vitals reviewed. Constitutional: He appears well-developed and well-nourished.  HENT:  Head: Normocephalic.  Neck: Normal range of motion.  Respiratory: Effort normal.  Musculoskeletal: Normal range of motion.  Neurological: He is alert.  Psychiatric: Thought content normal. His mood appears anxious. His affect is labile. He is aggressive and actively hallucinating. Cognition and memory are impaired. He expresses impulsivity. He is noncommunicative.    Review of Systems  Psychiatric/Behavioral: The patient is nervous/anxious.   All other systems reviewed and are negative.   Blood pressure 117/75, pulse 96, temperature 97.9 F (36.6 C), temperature source Oral, resp. rate 18, height 5' 11"  (1.803 m), weight 68.9 kg (152 lb), SpO2 98 %.Body mass index is 21.2 kg/m.  General Appearance: Casual  Eye Contact:  Good  Speech:  nonverbal  Volume:  nonverbal  Mood:  Anxious  Affect:  Blunt  Thought Process:  NA  Orientation:  Other:  person  Thought Content:  nonverbal  Suicidal Thoughts:  No  Homicidal Thoughts:  No  Memory:  unable to assess  Judgement:  Impaired  Insight:  unable to assess  Psychomotor Activity:  Increased  Concentration:  Concentration: Fair and Attention Span: Fair  Recall:  Poor  Fund of Knowledge:  Poor  Language:  nonverbal  Akathisia:  No  Handed:  Right  AIMS (if indicated):    N/A  Assets:  Housing Leisure Time Physical Health Resilience Social Support  ADL's:  Impaired  Cognition:  Impaired,  Severe  Sleep:    N/A     Treatment Plan Summary: Daily contact with patient to assess and evaluate symptoms and progress in treatment, Medication management and Plan autism with agitation:  -Crisis stabilization -Medication management:  Continue medical medications along with Vistaril 25 mg TID for anxiety, Ativan 1 mg TID for anxiety, and Geodon 60 mg BID for mood  stabilization -Individual counseling  Disposition: Recommend psychiatric Inpatient admission when medically cleared.  Waylan Boga, NP 01/26/2017 1:24 PM   Patient seen face-to-face for psychiatric evaluation, chart reviewed and case discussed with the physician extender and developed treatment plan. Reviewed the information documented and agree with the treatment plan.  Buford Dresser, DO

## 2017-01-26 NOTE — ED Notes (Signed)
Patient took meds crushed and dissolved in water-per care giver, important patient receives his Geodon and Ativan at 0800

## 2017-01-26 NOTE — ED Notes (Signed)
Pt just ripped mouse from the cord and wires exposed. Pt throwing the computer mouse around thwe room. RN notified.

## 2017-01-26 NOTE — ED Notes (Signed)
Pt took diaper off and is laying in the floor. Sitter tried to give pt crackers and threw them in the floor. Sitter also having to hold the door shut becausse pt keeps pulling at the door knob. Pt keeps trying to pull cords from the wall and is punching the computer.

## 2017-01-27 DIAGNOSIS — R4587 Impulsiveness: Secondary | ICD-10-CM

## 2017-01-27 MED ORDER — DIPHENHYDRAMINE HCL 50 MG/ML IJ SOLN
INTRAMUSCULAR | Status: AC
  Filled 2017-01-27: qty 1

## 2017-01-27 MED ORDER — LORAZEPAM 1 MG PO TABS: 1.0000 mg | ORAL_TABLET | Freq: Once | ORAL | Status: AC

## 2017-01-27 MED ORDER — DIPHENHYDRAMINE HCL 50 MG/ML IJ SOLN
25.0000 mg | Freq: Once | INTRAMUSCULAR | Status: AC
  Administered 2017-01-27: 25 mg via INTRAMUSCULAR

## 2017-01-27 MED ORDER — DIPHENHYDRAMINE HCL 25 MG PO CAPS: 25.0000 mg | ORAL_CAPSULE | Freq: Once | ORAL | Status: AC

## 2017-01-27 MED ORDER — HALOPERIDOL 5 MG PO TABS: 5.0000 mg | ORAL_TABLET | Freq: Once | ORAL | Status: AC

## 2017-01-27 MED ORDER — HYDROXYZINE HCL 25 MG PO TABS: 25.0000 mg | ORAL_TABLET | Freq: Three times a day (TID) | ORAL | 0 refills | Status: DC

## 2017-01-27 MED ORDER — LORAZEPAM 2 MG/ML IJ SOLN
1.0000 mg | Freq: Once | INTRAMUSCULAR | Status: AC
  Administered 2017-01-27: 1 mg via INTRAMUSCULAR
  Filled 2017-01-27: qty 1

## 2017-01-27 MED ORDER — HALOPERIDOL LACTATE 5 MG/ML IJ SOLN
5.0000 mg | Freq: Once | INTRAMUSCULAR | Status: AC
  Administered 2017-01-27: 5 mg via INTRAMUSCULAR
  Filled 2017-01-27: qty 1

## 2017-01-27 MED ORDER — LORAZEPAM 1 MG PO TABS: 1.0000 mg | ORAL_TABLET | Freq: Three times a day (TID) | ORAL | 0 refills | Status: DC

## 2017-01-27 MED ORDER — LORAZEPAM 1 MG PO TABS: 1.0000 mg | ORAL_TABLET | Freq: Three times a day (TID) | ORAL | 0 refills | Status: AC

## 2017-01-27 MED ORDER — ZIPRASIDONE HCL 60 MG PO CAPS: 60.0000 mg | ORAL_CAPSULE | Freq: Two times a day (BID) | ORAL | 0 refills | Status: DC

## 2017-01-27 NOTE — ED Notes (Signed)
Pt has refused all food and medications but took small sips of apple juice at this time. Pt brief currently CDI

## 2017-01-27 NOTE — ED Notes (Signed)
Pt kicking foot of bed.

## 2017-01-27 NOTE — Consult Note (Signed)
[name]   Reason for Russell:  Agitation  Referring Physician:  EDP Patient Identification: [name] MRN:  [id number] Principal Diagnosis: Autism Diagnosis:   Patient Active Problem List   Diagnosis Date Noted  . Autism [F84.0]     Priority: High  . ADHD (attention deficit hyperactivity disorder) [F90.9]     Priority: High  . Onychomycosis [B35.1] 10/29/2014  . Dystrophic nail [L60.3] 04/24/2013  . Pain in lower limb [M79.606] 04/24/2013    Total Time spent with patient: 30 minutes  Subjective:   [name] is a 27 y.o. male patient is at his baseline.  HPI:  27 yo male who presented to the ED with agitation, more than his normal.  Patient has been here since Monday.  He is calm and cooperative when his foster mother is present but typically not when she is gone.  Her goal was for him to be cooperative at his day program but unfortunately this has never occurred.  [facility] was sought for inpatient treatment with no success.  The ED, meanwhile, remains overstimulating to the patient.  Discussed with the mother yesterday that the ED was only exacerbating his symptoms and if he slept last night, he would be discharged tomorrow.  He did sleep last night.  Encouragement given to return to his outpatient provider for any continuing issues.    Past Psychiatric History: agitation, autism, anxiety  Risk to Self: Suicidal Ideation: No Suicidal Intent: No Is patient at risk for suicide?: No Suicidal Plan?: No Access to Means: No Intentional Self Injurious Behavior: None Risk to Others: Homicidal Ideation: No Thoughts of Harm to Others: No Current Homicidal Intent: No Current Homicidal Plan: No Access to Homicidal Means: No History of harm to others?: Yes Assessment of Violence: On admission Violent Behavior Description: Pt has been having sudden aggressive bx Does patient have access to weapons?: No Criminal Charges Pending?: No Does patient have  a court date: No Prior Inpatient Therapy: Prior Inpatient Therapy: No Prior Outpatient Therapy: Prior Outpatient Therapy: No Does patient have an ACCT team?: No Does patient have Intensive In-House Services?  : No Does patient have [facility] services? : No Does patient have P4CC services?: No  Past Medical History:  Past Medical History:  Diagnosis Date  . ADHD (attention deficit hyperactivity disorder)   . Autism   . Mental retardation    History reviewed. No pertinent surgical history. Family History: History reviewed. No pertinent family history. Family Psychiatric  History: unknown Social History:  Social History   Substance and Sexual Activity  Alcohol Use No  . Alcohol/week: 0.0 oz     Social History   Substance and Sexual Activity  Drug Use No    Social History   Socioeconomic History  . Marital status: Single    Spouse name: None  . Number of children: None  . Years of education: None  . Highest education level: None  Social Needs  . Financial resource strain: None  . Food insecurity - worry: None  . Food insecurity - inability: None  . Transportation needs - medical: None  . Transportation needs - non-medical: None  Occupational History  . None  Tobacco Use  . Smoking status: Never Smoker  . Smokeless tobacco: Never Used  Substance and Sexual Activity  . Alcohol use: No    Alcohol/week: 0.0 oz  . Drug use: No  . Sexual activity: None  Other Topics Concern  . None  Social History Narrative  . None  Additional Social History: N/A    Allergies:  No Known Allergies  Labs:  No results found for this or any previous visit (from the past 48 hour(s)).  Current Facility-Administered Medications  Medication Dose Route Frequency Provider Last Rate Last Dose  . benzonatate (TESSALON) capsule 100 mg  100 mg Oral BID Lorre NickAllen, Anthony, MD   100 mg at 01/26/17 [id number]  . ciclopirox (PENLAC) 8 % solution   Topical QHS Lorre NickAllen, Anthony, MD      .  clindamycin-benzoyl peroxide [facility](BENZACLIN) gel 1 application  1 application Topical BID Lorre NickAllen, Anthony, MD   1 application at 01/25/17 [phone]  . fluticasone (FLONASE) 50 MCG/ACT nasal spray 1-2 spray  1-2 spray Each Nare Daily Lorre NickAllen, Anthony, MD   2 spray at 01/26/17 [id number]  . hydrOXYzine (ATARAX/VISTARIL) tablet 25 mg  25 mg Oral TID Laveda AbbeParks, Laurie Britton, NP   25 mg at 01/26/17 1652  . loratadine (CLARITIN) tablet 10 mg  10 mg Oral Daily Lorre NickAllen, Anthony, MD   10 mg at 01/26/17 0925  . LORazepam (ATIVAN) tablet 1 mg  1 mg Oral TID Laveda AbbeParks, Laurie Britton, NP   1 mg at 01/26/17 1652  . LORazepam (ATIVAN) tablet 1 mg  1 mg Oral Once Cherly Beachorman, Kindsey Eblin J, DO      . pantoprazole (PROTONIX) EC tablet 40 mg  40 mg Oral Daily Lorre NickAllen, Anthony, MD   40 mg at 01/26/17 [id number]  . polyethylene glycol (MIRALAX / GLYCOLAX) packet 17 g  17 g Oral Daily Lorre NickAllen, Anthony, MD   17 g at 01/26/17 0925  . ziprasidone (GEODON) capsule 60 mg  60 mg Oral BID WC Laveda AbbeParks, Laurie Britton, NP   60 mg at 01/26/17 1653   Current Outpatient Medications  Medication Sig Dispense Refill  . benzonatate (TESSALON) 100 MG capsule Take 100 mg by mouth 2 (two) times daily.    . cetirizine (ZYRTEC) 10 MG tablet Take 10 mg by mouth daily.     . ciclopirox (PENLAC) 8 % solution Apply topically at bedtime. Apply over nail and surrounding skin. Apply daily over previous coat. After seven (7) days, may remove with alcohol and continue cycle. 6.6 mL 6  . clindamycin-benzoyl peroxide (BENZACLIN) gel Apply 1 application topically 2 (two) times daily.     . fluticasone (FLONASE) 50 MCG/ACT nasal spray Place 1-2 sprays into both nostrils daily.    . hydrOXYzine (ATARAX/VISTARIL) 25 MG tablet Take 25 mg by mouth 3 (three) times daily.    Marland Kitchen. omeprazole (PRILOSEC) 20 MG capsule Take 20 mg by mouth 2 (two) times daily before a meal.    . polyethylene glycol powder (GLYCOLAX/MIRALAX) powder Take 17 g by mouth daily.    Marland Kitchen. LORazepam (ATIVAN) 1 MG tablet Take 1 tablet  (1 mg total) by mouth 3 (three) times daily. 30 tablet 0  . ziprasidone (GEODON) 60 MG capsule Take 1 capsule (60 mg total) by mouth 2 (two) times daily with a meal. 60 capsule 0    Musculoskeletal: Strength & Muscle Tone: within normal limits Gait & Station: normal Patient leans: N/A  Psychiatric Specialty Exam: Physical Exam  Nursing note and vitals reviewed. Constitutional: He appears well-developed and well-nourished.  HENT:  Head: Normocephalic and atraumatic.  Neck: Normal range of motion.  Respiratory: Effort normal.  Musculoskeletal: Normal range of motion.  Neurological: He is alert.  Psychiatric: His behavior is normal. Thought content normal. His mood appears anxious. His affect is labile. Cognition and memory are impaired. He expresses impulsivity. He is  noncommunicative.    Review of Systems  Psychiatric/Behavioral: The patient is nervous/anxious.   All other systems reviewed and are negative.   Blood pressure 117/75, pulse 96, temperature 97.9 F (36.6 C), temperature source Oral, resp. rate 18, height 5\' 11"  (1.803 m), weight 68.9 kg (152 lb), SpO2 98 %.Body mass index is 21.2 kg/m.  General Appearance: Casual  Eye Contact:  Good  Speech:  nonverbal  Volume:  nonverbal  Mood:  UTA since nonverbal.  Affect:  Blunt  Thought Process:  NA  Orientation:  Other:  person  Thought Content:  nonverbal  Suicidal Thoughts:  No  Homicidal Thoughts:  No  Memory:  unable to assess  Judgement:  Impaired  Insight:  unable to assess  Psychomotor Activity:  Increased at times  Concentration:  Concentration: UTA since nonverbal. and Attention Span: UTA since nonverbal.  Recall:  UTA since nonverbal.  Fund of Knowledge:  Poor  Language:  nonverbal  Akathisia:  No  Handed:  Right  AIMS (if indicated):    N/A  Assets:  Housing Leisure Time Physical Health Social Support  ADL's:  Impaired  Cognition:  Impaired,  Severe  Sleep:    N/A     Treatment Plan  Summary: Daily contact with patient to assess and evaluate symptoms and progress in treatment, Medication management and Plan autism with agitation:  -Crisis stabilization -Medication management:  Continue medical medications along with Vistaril 25 mg TID for anxiety, Ativan 1 mg TID for anxiety, and Geodon 60 mg BID for mood stabilization -Individual counseling  Disposition: Discharge home  Nanine Means, NP 01/27/2017 10:36 AM    Patient seen face-to-face for psychiatric evaluation, chart reviewed and case discussed with the physician extender and developed treatment plan. Reviewed the information documented and agree with the treatment plan.  Juanetta Beets, DO 01/27/17 6:27 PM

## 2017-01-27 NOTE — ED Notes (Signed)
[facility].  Pt grabbed Security by shirt.  Barbara CowerJason, NP called & informed of pt's behavior.

## 2017-01-27 NOTE — [facility] Suicide Risk Assessment (Signed)
Suicide Risk Assessment  Discharge Assessment   [facility]   Principal Problem: Autism Discharge Diagnoses:  Patient Active Problem List   Diagnosis Date Noted  . Autism [F84.0]     Priority: High  . ADHD (attention deficit hyperactivity disorder) [F90.9]     Priority: High  . Onychomycosis [B35.1] 10/29/2014  . Dystrophic nail [L60.3] 04/24/2013  . Pain in lower limb [M79.606] 04/24/2013    Total Time spent with patient: 30 minutes   Musculoskeletal: Strength & Muscle Tone: within normal limits Gait & Station: normal Patient leans: N/A  Psychiatric Specialty Exam: Physical Exam  Nursing note and vitals reviewed. Constitutional: He appears well-developed and well-nourished.  HENT:  Head: Normocephalic.  Neck: Normal range of motion.  Respiratory: Effort normal.  Musculoskeletal: Normal range of motion.  Neurological: He is alert.  Psychiatric: His behavior is normal. Thought content normal. His mood appears anxious. His affect is labile. Cognition and memory are impaired. He expresses impulsivity. He is noncommunicative.    Review of Systems  Psychiatric/Behavioral: The patient is nervous/anxious.   All other systems reviewed and are negative.   Blood pressure 117/75, pulse 96, temperature 97.9 F (36.6 C), temperature source Oral, resp. rate 18, height 5\' 11"  (1.803 m), weight 68.9 kg (152 lb), SpO2 98 %.Body mass index is 21.2 kg/m.  General Appearance: Casual  Eye Contact:  Good  Speech:  nonverbal  Volume:  nonverbal  Mood:  Anxious  Affect:  Blunt  Thought Process:  NA  Orientation:  Other:  person  Thought Content:  nonverbal  Suicidal Thoughts:  No  Homicidal Thoughts:  No  Memory:  unable to assess  Judgement:  Impaired  Insight:  unable to assess  Psychomotor Activity:  Increased at times  Concentration:  Concentration: Fair and Attention Span: Fair  Recall:  Poor  Fund of Knowledge:  Poor  Language:  nonverbal   Akathisia:  No  Handed:  Right  AIMS (if indicated):    N/A  Assets:  Housing Leisure Time Physical Health Resilience Social Support  ADL's:  Impaired  Cognition:  Impaired,  Severe  Sleep:    N/A    Mental Status Per Nursing Assessment::   On Admission:   agitation   Demographic Factors:  Male and Adolescent or young adult  Loss Factors: NA  Historical Factors: Impulsivity  Risk Reduction Factors:   NA  Continued Clinical Symptoms:  Anxiety   Cognitive Features That Contribute To Risk:  Unable to assess due to nonverbal state, IDD  Suicide Risk:  Minimal: No identifiable suicidal ideation.  Patients presenting with no risk factors but with morbid ruminations; may be classified as minimal risk based on the severity of the depressive symptoms    Plan Of Care/Follow-up recommendations:  Activity:  as tolerated Diet:  heart healthy diet  Yoana Staib, NP 01/27/2017, 10:43 AM

## 2017-01-27 NOTE — ED Notes (Signed)
SBAR Report received from previous nurse. Pt received calm and visible on unit. Pt mute and cannot endorse current SI/ HI, A/V H, depression, anxiety, or pain at this time, and appears otherwise stable and free of distress. Will continue to assess.

## 2017-01-27 NOTE — ED Notes (Signed)
Pt had a BM, pulled pants off more BM on bed.

## 2017-01-27 NOTE — ED Notes (Signed)
Pt up OOB, removed brief & threw it at the door.

## 2017-01-28 DIAGNOSIS — F419 Anxiety disorder, unspecified: Secondary | ICD-10-CM | POA: Diagnosis not present

## 2017-01-28 DIAGNOSIS — F909 Attention-deficit hyperactivity disorder, unspecified type: Secondary | ICD-10-CM

## 2017-01-28 DIAGNOSIS — R4587 Impulsiveness: Secondary | ICD-10-CM | POA: Diagnosis not present

## 2017-01-28 DIAGNOSIS — F840 Autistic disorder: Secondary | ICD-10-CM | POA: Diagnosis not present

## 2017-01-28 DIAGNOSIS — R450 Nervousness: Secondary | ICD-10-CM | POA: Diagnosis not present

## 2017-01-28 DIAGNOSIS — F79 Unspecified intellectual disabilities: Secondary | ICD-10-CM

## 2017-01-28 LAB — FOLATE: Folate: 30.4 ng/mL (ref >5.9)

## 2017-01-28 LAB — VITAMIN B12: VITAMIN B 12: 295 pg/mL (ref 180–914)

## 2017-01-28 MED ORDER — GUANFACINE HCL 1 MG PO TABS
2.0000 mg | ORAL_TABLET | Freq: Two times a day (BID) | ORAL | Status: DC
  Administered 2017-01-28 – 2017-01-30 (×3): 2 mg via ORAL
  Filled 2017-01-28 (×6): qty 2

## 2017-01-28 MED ORDER — HYDROXYZINE HCL 25 MG PO TABS: 25.0000 mg | ORAL_TABLET | Freq: Three times a day (TID) | ORAL | 0 refills | Status: DC

## 2017-01-28 MED ORDER — CYPROHEPTADINE HCL 4 MG PO TABS
4.0000 mg | ORAL_TABLET | Freq: Two times a day (BID) | ORAL | Status: DC
  Administered 2017-01-28 – 2017-01-30 (×3): 4 mg via ORAL
  Filled 2017-01-28 (×6): qty 1

## 2017-01-28 MED ORDER — HYDROXYZINE HCL 25 MG PO TABS
25.0000 mg | ORAL_TABLET | Freq: Three times a day (TID) | ORAL | Status: DC
  Administered 2017-01-28 – 2017-01-30 (×4): 25 mg via ORAL
  Filled 2017-01-28 (×5): qty 1

## 2017-01-28 MED ORDER — GUANFACINE HCL 2 MG PO TABS: 2.0000 mg | ORAL_TABLET | Freq: Two times a day (BID) | ORAL | 0 refills | Status: DC

## 2017-01-28 MED ORDER — HYDROXYZINE HCL 25 MG PO TABS: 25.0000 mg | ORAL_TABLET | Freq: Three times a day (TID) | ORAL | Status: DC

## 2017-01-28 MED ORDER — ASENAPINE MALEATE 5 MG SL SUBL
5.0000 mg | SUBLINGUAL_TABLET | Freq: Every day | SUBLINGUAL | Status: AC | PRN
Start: 2017-01-28 — End: 2017-01-28
  Administered 2017-01-28: 5 mg via SUBLINGUAL
  Filled 2017-01-28: qty 1

## 2017-01-28 MED ORDER — ASENAPINE MALEATE 5 MG SL SUBL: 5.0000 mg | SUBLINGUAL_TABLET | Freq: Every day | SUBLINGUAL | 0 refills | Status: DC | PRN

## 2017-01-28 MED ORDER — ZIPRASIDONE MESYLATE 20 MG IM SOLR
10.0000 mg | Freq: Once | INTRAMUSCULAR | Status: AC
  Administered 2017-01-28: 10 mg via INTRAMUSCULAR
  Filled 2017-01-28: qty 20

## 2017-01-28 MED ORDER — SODIUM CHLORIDE 0.9 % IV SOLN
Freq: Once | INTRAVENOUS | Status: AC
  Administered 2017-01-28: 16:00:00 via INTRAVENOUS

## 2017-01-28 MED ORDER — ZIPRASIDONE HCL 20 MG PO CAPS
60.0000 mg | ORAL_CAPSULE | Freq: Two times a day (BID) | ORAL | Status: DC
  Administered 2017-01-28 – 2017-01-30 (×5): 60 mg via ORAL
  Filled 2017-01-28 (×5): qty 3

## 2017-01-28 MED ORDER — ZIPRASIDONE HCL 60 MG PO CAPS: 60.0000 mg | ORAL_CAPSULE | Freq: Two times a day (BID) | ORAL | 0 refills | Status: DC

## 2017-01-28 MED ORDER — CYPROHEPTADINE HCL 4 MG PO TABS: 4.0000 mg | ORAL_TABLET | Freq: Two times a day (BID) | ORAL | 0 refills | Status: DC

## 2017-01-28 NOTE — ED Notes (Signed)
Patient given IM injection as ordered by MD. Security and [employer] at bedside for assistance with medication administration. Pt tolerated well; no signs of distress noted. Sitter remains at bedside for safety.

## 2017-01-28 NOTE — ED Notes (Signed)
Legal guardian is here to take patient home

## 2017-01-28 NOTE — Consult Note (Signed)
[facility] Face-to-Face Psychiatry Consult   Reason for Consult:  Agitation  Referring Physician:  EDP Patient Identification: [name] MRN:  [id number] Principal Diagnosis: Autism disorder Diagnosis:   Patient Active Problem List   Diagnosis Date Noted  . Autism disorder [F84.0]     Priority: High  . ADHD (attention deficit hyperactivity disorder) [F90.9]     Priority: High  . 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 Onychomycosis [B35.1] 10/29/2014  . Dystrophic nail [L60.3] 04/24/2013  . Pain in lower limb [M79.606] 04/24/2013    Total Time spent with patient: 30 minutes  Subjective:   [name] is a 27 y.o. male patient is at his baseline.  HPI:  27 yo male who presented to the ED with agitation, more than his normal.  Patient's medications were adjusted and he has been calm since yesterday.  His intake in not optimal so Periactin started to increase his appetite.  His foster mother is here and agreeable to discharge plan.  Advised by her psychiatrist to return [facility] IF he needs to return as they are better equipped to handle him.    Past Psychiatric History: agitation, autism, anxiety  Risk to Self: Suicidal Ideation: No Suicidal Intent: No Is patient at risk for suicide?: No Suicidal Plan?: No Access to Means: No Intentional Self Injurious Behavior: None Risk to Others: None Prior Inpatient Therapy: Prior Inpatient Therapy: No Prior Outpatient Therapy: Prior Outpatient Therapy: No Does patient have an ACCT team?: No Does patient have Intensive In-House Services?  : No Does patient have [facility] services? : No Does patient have P4CC services?: No  Past Medical History:  Past Medical History:  Diagnosis Date  . ADHD (attention deficit hyperactivity disorder)   . Autism   . Mental retardation    History reviewed. No pertinent surgical history. Family History: History reviewed. No pertinent family history. Family Psychiatric  History: unknown Social  History:  Social History   Substance and Sexual Activity  Alcohol Use No  . Alcohol/week: 0.0 oz     Social History   Substance and Sexual Activity  Drug Use No    Social History   Socioeconomic History  . Marital status: Single    Spouse name: None  . Number of children: None  . Years of education: None  . Highest education level: None  Social Needs  . Financial resource strain: None  . Food insecurity - worry: None  . Food insecurity - inability: None  . Transportation needs - medical: None  . Transportation needs - non-medical: None  Occupational History  . None  Tobacco Use  . Smoking status: Never Smoker  . Smokeless tobacco: Never Used  Substance and Sexual Activity  . Alcohol use: No    Alcohol/week: 0.0 oz  . Drug use: No  . Sexual activity: None  Other Topics Concern  . None  Social History Narrative  . None   Additional Social History: N/A    Allergies:  No Known Allergies  Labs:  No results found for this or any previous visit (from the past 48 hour(s)).  Current Facility-Administered Medications  Medication Dose Route Frequency Provider Last Rate Last Dose  . asenapine (SAPHRIS) sublingual tablet 5 mg  5 mg Sublingual Daily PRN Nyrie Sigal, MD      . benzonatate (TESSALON) capsule 100 mg  100 mg Oral BID Lorre Nick, MD   100 mg at 01/26/17 7829  . ciclopirox (PENLAC) 8 % solution   Topical QHS Lorre Nick, MD      .  cyproheptadine (PERIACTIN) 4 MG tablet 4 mg  4 mg Oral BID Hideko Esselman, MD      . fluticasone (FLONASE) 50 MCG/ACT nasal spray 1-2 spray  1-2 spray Each Nare Daily Lorre NickAllen, Anthony, MD   2 spray at 01/26/17 [phone]  . guanFACINE (TENEX) tablet 2 mg  2 mg Oral BID Bonnye Halle, MD      . hydrOXYzine (ATARAX/VISTARIL) tablet 25 mg  25 mg Oral TID Jenner Rosier, MD      . loratadine (CLARITIN) tablet 10 mg  10 mg Oral Daily Lorre NickAllen, Anthony, MD   10 mg at 01/26/17 0925  . LORazepam (ATIVAN) tablet 1 mg  1 mg Oral TID  Thedore MinsAkintayo, Uri Turnbough, MD   1 mg at 01/26/17 1652  . pantoprazole (PROTONIX) EC tablet 40 mg  40 mg Oral Daily Lorre NickAllen, Anthony, MD   40 mg at 01/26/17 [id number]  . polyethylene glycol (MIRALAX / GLYCOLAX) packet 17 g  17 g Oral Daily Lorre NickAllen, Anthony, MD   17 g at 01/26/17 0925  . ziprasidone (GEODON) capsule 60 mg  60 mg Oral BID WC Thedore MinsAkintayo, Ajahnae Rathgeber, MD       Current Outpatient Medications  Medication Sig Dispense Refill  . benzonatate (TESSALON) 100 MG capsule Take 100 mg by mouth 2 (two) times daily.    . cetirizine (ZYRTEC) 10 MG tablet Take 10 mg by mouth daily.     . ciclopirox (PENLAC) 8 % solution Apply topically at bedtime. Apply over nail and surrounding skin. Apply daily over previous coat. After seven (7) days, may remove with alcohol and continue cycle. 6.6 mL 6  . clindamycin-benzoyl peroxide (BENZACLIN) gel Apply 1 application topically 2 (two) times daily.     . fluticasone (FLONASE) 50 MCG/ACT nasal spray Place 1-2 sprays into both nostrils daily.    Marland Kitchen. omeprazole (PRILOSEC) 20 MG capsule Take 20 mg by mouth 2 (two) times daily before a meal.    . polyethylene glycol powder (GLYCOLAX/MIRALAX) powder Take 17 g by mouth daily.    . hydrOXYzine (ATARAX/VISTARIL) 25 MG tablet Take 1 tablet (25 mg total) by mouth 3 (three) times daily. 60 tablet 0  . LORazepam (ATIVAN) 1 MG tablet Take 1 tablet (1 mg total) by mouth 3 (three) times daily. 30 tablet 0  . ziprasidone (GEODON) 60 MG capsule Take 1 capsule (60 mg total) by mouth 2 (two) times daily with a meal. 60 capsule 0    Musculoskeletal: Strength & Muscle Tone: within normal limits Gait & Station: normal Patient leans: N/A  Psychiatric Specialty Exam: Physical Exam  Nursing note and vitals reviewed. Constitutional: He appears well-developed and well-nourished.  HENT:  Head: Normocephalic and atraumatic.  Neck: Normal range of motion.  Respiratory: Effort normal.  Musculoskeletal: Normal range of motion.  Neurological: He is alert.   Psychiatric: His behavior is normal. Thought content normal. His affect is labile. Cognition and memory are impaired. He expresses impulsivity. He is noncommunicative.    Review of Systems  Psychiatric/Behavioral: The patient is nervous/anxious.   All other systems reviewed and are negative.   Blood pressure (!) 138/94, pulse (!) 134, temperature 97.6 F (36.4 C), temperature source Oral, resp. rate 20, height 5\' 11"  (1.803 m), weight 68.9 kg (152 lb), SpO2 98 %.Body mass index is 21.2 kg/m.  General Appearance: Casual  Eye Contact:  Good  Speech:  nonverbal  Volume:  nonverbal  Mood:  UTA since nonverbal.  Affect:  Blunt  Thought Process:  NA  Orientation:  Other:  person  Thought Content:  nonverbal  Suicidal Thoughts:  No  Homicidal Thoughts:  No  Memory:  unable to assess  Judgement:  Impaired  Insight:  unable to assess  Psychomotor Activity:  Increased at times  Concentration:  Concentration: UTA since nonverbal. and Attention Span: UTA since nonverbal.  Recall:  UTA since nonverbal.  Fund of Knowledge:  Poor  Language:  nonverbal  Akathisia:  No  Handed:  Right  AIMS (if indicated):    N/A  Assets:  Housing Leisure Time Physical Health Social Support  ADL's:  Impaired  Cognition:  Impaired,  Severe  Sleep:    N/A     Treatment Plan Summary: Daily contact with patient to assess and evaluate symptoms and progress in treatment, Medication management and Plan autism with agitation:  -Crisis stabilization -Medication management:  Continue medical medications along with Vistaril 25 mg TID for anxiety, Ativan 1 mg TID for anxiety, and Geodon 60 mg BID for mood stabilization.  Started Saphris 5 mg daily PRN agitation, Tenex 2 mg BID for attention, Periactin 4 mg daily for appetite -Individual counseling  Disposition: Discharge home  Nanine Means, NP 01/28/2017 10:43 AM    Patient seen face-to-face for psychiatric evaluation, chart reviewed and case discussed with  the physician extender and developed treatment plan. Reviewed the information documented and agree with the treatment plan.  Davione Lenker,MD 01/28/17 10:43 AM

## 2017-01-28 NOTE — Progress Notes (Signed)
CSW notified pt's AFL supervisorSirlina Pinnix at ph: [phone] that [facility] would be contacted by the CSW Asst Director and notified that the pt was not given a 30 day notice and that no police charges were filed and that as such the AFL will be expected to take the pt back.    Sirlina Pinnix at ph: [phone] stated dhe understood and had been in contact with [facility] today notifying them and requesting assistance with finding an alternate placement for the pt, but that she was told no authorization for assistance from [facility] can be received until Monday.  CSW stated plan if for Sirlina Pinnix at ph: [phone] to take the pt back and if not the CSW Asst Director will be notified.  Dorothe PeaJonathan F. Seng Fouts, LCSW, LCAS, CSI Clinical Social Worker Ph: [phone]
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
## 2017-01-28 NOTE — [facility] Suicide Risk Assessment (Signed)
Suicide Risk Assessment  Discharge Assessment   [facility]   Principal Problem: Autism disorder Discharge Diagnoses:  Patient Active Problem List   Diagnosis Date Noted  . Autism disorder [F84.0]     Priority: High  . ADHD (attention deficit hyperactivity disorder) [F90.9]     Priority: High  . 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 Onychomycosis [B35.1] 10/29/2014  . Dystrophic nail [L60.3] 04/24/2013  . Pain in lower limb [M79.606] 04/24/2013    Total Time spent with patient: 30 minutes   Musculoskeletal: Strength & Muscle Tone: within normal limits Gait & Station: normal Patient leans: N/A  Psychiatric Specialty Exam: Physical Exam  Nursing note and vitals reviewed. Constitutional: He appears well-developed and well-nourished.  HENT:  Head: Normocephalic.  Neck: Normal range of motion.  Respiratory: Effort normal.  Musculoskeletal: Normal range of motion.  Neurological: He is alert.  Psychiatric: His behavior is normal. Thought content normal. His mood appears anxious. His affect is labile. Cognition and memory are impaired. He expresses impulsivity. He is noncommunicative.    Review of Systems  Psychiatric/Behavioral: The patient is nervous/anxious.   All other systems reviewed and are negative.   Blood pressure 117/75, pulse 96, temperature 97.9 F (36.6 C), temperature source Oral, resp. rate 18, height 5\' 11"  (1.803 m), weight 68.9 kg (152 lb), SpO2 98 %.Body mass index is 21.2 kg/m.  General Appearance: Casual  Eye Contact:  Good  Speech:  nonverbal  Volume:  nonverbal  Mood:  Anxious  Affect:  Blunt  Thought Process:  NA  Orientation:  Other:  person  Thought Content:  nonverbal  Suicidal Thoughts:  No  Homicidal Thoughts:  No  Memory:  unable to assess  Judgement:  Impaired  Insight:  unable to assess  Psychomotor Activity:  Increased at times  Concentration:  Concentration: Fair and Attention Span: Fair   Recall:  Poor  Fund of Knowledge:  Poor  Language:  nonverbal  Akathisia:  No  Handed:  Right  AIMS (if indicated):    N/A  Assets:  Housing Leisure Time Physical Health Resilience Social Support  ADL's:  Impaired  Cognition:  Impaired,  Severe  Sleep:    N/A    Mental Status Per Nursing Assessment::   On Admission:   agitation   Demographic Factors:  Male and Adolescent or young adult  Loss Factors: NA  Historical Factors: Impulsivity  Risk Reduction Factors:   NA  Continued Clinical Symptoms:  Anxiety   Cognitive Features That Contribute To Risk:  Unable to assess due to nonverbal state, IDD  Suicide Risk:  Minimal: No identifiable suicidal ideation.  Patients presenting with no risk factors but with morbid ruminations; may be classified as minimal risk based on the severity of the depressive symptoms    Plan Of Care/Follow-up recommendations:  Activity:  as tolerated Diet:  heart healthy diet  LORD, JAMISON, NP 01/28/2017, 11:00 AM

## 2017-01-28 NOTE — ED Notes (Signed)
Family concerned about patient discharge. Boss of foster mom at bedside. Catha NottinghamJamison, NP consulted. In room with family to discuss options.
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
## 2017-01-28 NOTE — ED Notes (Signed)
Patient lying in bed with the covers over his face. Pt looking at staff briefly, then placing blanket back to avoid interaction. Pt unable to verbalize with staff. Pt not able to follow directions, but is not noted to show signs of aggression at this time. No signs of distress noted at this time; respirations regular an unlabored. Sitter at bedside for safety.

## 2017-01-29 DIAGNOSIS — R4587 Impulsiveness: Secondary | ICD-10-CM | POA: Diagnosis not present

## 2017-01-29 DIAGNOSIS — F909 Attention-deficit hyperactivity disorder, unspecified type: Secondary | ICD-10-CM | POA: Diagnosis not present

## 2017-01-29 DIAGNOSIS — R450 Nervousness: Secondary | ICD-10-CM | POA: Diagnosis not present

## 2017-01-29 DIAGNOSIS — F419 Anxiety disorder, unspecified: Secondary | ICD-10-CM | POA: Diagnosis not present

## 2017-01-29 DIAGNOSIS — F840 Autistic disorder: Secondary | ICD-10-CM | POA: Diagnosis not present

## 2017-01-29 MED ORDER — ENSURE ENLIVE PO LIQD
237.0000 mL | Freq: Two times a day (BID) | ORAL | Status: DC
  Administered 2017-01-29 – 2017-01-30 (×3): 237 mL via ORAL
  Filled 2017-01-29 (×4): qty 237

## 2017-01-29 MED ORDER — SODIUM CHLORIDE 0.9 % IV BOLUS (SEPSIS)
1000.0000 mL | Freq: Once | INTRAVENOUS | Status: AC
  Administered 2017-01-29: 1000 mL via INTRAVENOUS

## 2017-01-29 NOTE — ED Notes (Addendum)
BMET COLLECTED. COLLECTION MANAGER WOULD NOT GIVE YELLOW LABELS. ATTEMPTED SEVERAL TIMES.

## 2017-01-29 NOTE — ED Notes (Signed)
Pt noted to have a dry brief. No urination this shift. Dr. Blinda LeatherwoodPollina notified; IV fluids started as ordered.

## 2017-01-29 NOTE — Consult Note (Addendum)
[facility] Psych ED Progress Note  01/29/2017 11:05 AM [name]  MRN:  [id number] Subjective/Objective: Patient was seen, evaluated in the presence of his foster mother. Patient with history of Autistic disorder, ADHD, Moderate intellectual disabilities who  is no-verbal. Patient was scheduled for discharged yesterday which was cancelled due to out of control behavior. Patient refused to get out of his bed and when he did he refused to walk. He was reported to be aggressive 2 days ago and was not eating until yesterday. [name] mother is willing to take him home if he will only get out of bed but so far he has refused. Staff nurse reports that he has become less aggressive since last night. It is difficult to determine what is going on with the patient because he is non-verbal. Patient has been placed on ensure by EDP because he is not accepting regular food.  Principal Problem: Autism disorder Diagnosis:   Patient Active Problem List   Diagnosis Date Noted  . Intellectual disability [F79] 01/28/2017  . Onychomycosis [B35.1] 10/29/2014  . Dystrophic nail [L60.3] 04/24/2013  . Pain in lower limb [M79.606] 04/24/2013  . Autism disorder [F84.0]   . ADHD (attention deficit hyperactivity disorder) [F90.9]    Total Time spent with patient: 30 minutes  Past Psychiatric History: as above  Past Medical History:  Past Medical History:  Diagnosis Date  . ADHD (attention deficit hyperactivity disorder)   . Autism   . Mental retardation    History reviewed. No pertinent surgical history. Family History: History reviewed. No pertinent family history. Family Psychiatric  History:  Social History:  Social History   Substance and Sexual Activity  Alcohol Use No  . Alcohol/week: 0.0 oz     Social History   Substance and Sexual Activity  Drug Use No    Social History   Socioeconomic History  . Marital status: Single    Spouse name: None  . Number of children: None  . Years of education: None   . Highest education level: None  Social Needs  . Financial resource strain: None  . Food insecurity - worry: None  . Food insecurity - inability: None  . Transportation needs - medical: None  . Transportation needs - non-medical: None  Occupational History  . None  Tobacco Use  . Smoking status: Never Smoker  . Smokeless tobacco: Never Used  Substance and Sexual Activity  . Alcohol use: No    Alcohol/week: 0.0 oz  . Drug use: No  . Sexual activity: None  Other Topics Concern  . None  Social History Narrative  . None    Sleep: Fair  Appetite:  Poor  Current Medications: Current Facility-Administered Medications  Medication Dose Route Frequency Provider Last Rate Last Dose  . benzonatate (TESSALON) capsule 100 mg  100 mg Oral BID Lorre Nick, MD   100 mg at 01/29/17 1054  . ciclopirox (PENLAC) 8 % solution   Topical QHS Lorre Nick, MD      . cyproheptadine (PERIACTIN) 4 MG tablet 4 mg  4 mg Oral BID [name], Elior Robinette, MD   4 mg at 01/29/17 1052  . feeding supplement (ENSURE ENLIVE) (ENSURE ENLIVE) liquid 237 mL  237 mL Oral BID BM Bethann Berkshire, MD   237 mL at 01/29/17 1048  . fluticasone (FLONASE) 50 MCG/ACT nasal spray 1-2 spray  1-2 spray Each Nare Daily Lorre Nick, MD   2 spray at 01/29/17 1054  . guanFACINE (TENEX) tablet 2 mg  2 mg Oral BID  Thedore Mins, MD   2 mg at 01/29/17 1053  . hydrOXYzine (ATARAX/VISTARIL) tablet 25 mg  25 mg Oral TID Thedore Mins, MD   25 mg at 01/29/17 1049  . loratadine (CLARITIN) tablet 10 mg  10 mg Oral Daily Lorre Nick, MD   10 mg at 01/29/17 1054  . LORazepam (ATIVAN) tablet 1 mg  1 mg Oral TID Thedore Mins, MD   1 mg at 01/29/17 1052  . pantoprazole (PROTONIX) EC tablet 40 mg  40 mg Oral Daily Lorre Nick, MD   40 mg at 01/29/17 1049  . polyethylene glycol (MIRALAX / GLYCOLAX) packet 17 g  17 g Oral Daily Lorre Nick, MD   17 g at 01/29/17 1048  . ziprasidone (GEODON) capsule 60 mg  60 mg Oral BID WC  Leamon Palau, MD   60 mg at 01/29/17 1610   Current Outpatient Medications  Medication Sig Dispense Refill  . benzonatate (TESSALON) 100 MG capsule Take 100 mg by mouth 2 (two) times daily.    . cetirizine (ZYRTEC) 10 MG tablet Take 10 mg by mouth daily.     . ciclopirox (PENLAC) 8 % solution Apply topically at bedtime. Apply over nail and surrounding skin. Apply daily over previous coat. After seven (7) days, may remove with alcohol and continue cycle. 6.6 mL 6  . clindamycin-benzoyl peroxide (BENZACLIN) gel Apply 1 application topically 2 (two) times daily.     . fluticasone (FLONASE) 50 MCG/ACT nasal spray Place 1-2 sprays into both nostrils daily.    Marland Kitchen omeprazole (PRILOSEC) 20 MG capsule Take 20 mg by mouth 2 (two) times daily before a meal.    . polyethylene glycol powder (GLYCOLAX/MIRALAX) powder Take 17 g by mouth daily.    Marland Kitchen asenapine (SAPHRIS) 5 MG SUBL 24 hr tablet Place 1 tablet (5 mg total) under the tongue daily as needed (agitation). 30 tablet 0  . cyproheptadine (PERIACTIN) 4 MG tablet Take 1 tablet (4 mg total) by mouth 2 (two) times daily. 60 tablet 0  . guanFACINE (TENEX) 2 MG tablet Take 1 tablet (2 mg total) by mouth 2 (two) times daily. 60 tablet 0  . hydrOXYzine (ATARAX/VISTARIL) 25 MG tablet Take 1 tablet (25 mg total) by mouth 3 (three) times daily. 60 tablet 0  . hydrOXYzine (ATARAX/VISTARIL) 25 MG tablet Take 1 tablet (25 mg total) by mouth 3 (three) times daily. 30 tablet 0  . LORazepam (ATIVAN) 1 MG tablet Take 1 tablet (1 mg total) by mouth 3 (three) times daily. 30 tablet 0  . ziprasidone (GEODON) 60 MG capsule Take 1 capsule (60 mg total) by mouth 2 (two) times daily with a meal. 60 capsule 0  . ziprasidone (GEODON) 60 MG capsule Take 1 capsule (60 mg total) by mouth 2 (two) times daily with a meal. 60 capsule 0    Lab Results:  Results for orders placed or performed during the hospital encounter of 01/24/17 (from the past 48 hour(s))  Folate     Status: None    Collection Time: 01/28/17  9:50 AM  Result Value Ref Range   Folate 30.4 >5.9 ng/mL    Comment: Performed at [facility], [address], [place], [place] [address]  Vitamin B12     Status: None   Collection Time: 01/28/17  9:50 AM  Result Value Ref Range   Vitamin B-12 295 180 - 914 pg/mL    Comment: (NOTE) This assay is not validated for testing neonatal or myeloproliferative syndrome specimens  for Vitamin B12 levels. Performed at [facility], [address] [place], [place] [address]     Blood Alcohol level:  Lab Results  Component Value Date   ETH <10 01/24/2017    Physical Findings: AIMS:  , ,  ,  ,    CIWA:    COWS:     Musculoskeletal: Strength & Muscle Tone: within normal limits Gait & Station: normal Patient leans: N/A  Psychiatric Specialty Exam: Physical Exam  Psychiatric: Thought content normal. His mood appears anxious. He is aggressive and combative. Cognition and memory are impaired. He expresses impulsivity. He is noncommunicative.    Review of Systems  Constitutional: Positive for malaise/fatigue.  Eyes: Negative.   Cardiovascular: Negative.   Genitourinary: Negative.   Musculoskeletal: Negative.   Skin: Negative.   Endo/Heme/Allergies: Negative.   Psychiatric/Behavioral: The patient is nervous/anxious.     Blood pressure 135/71, pulse (!) 110, temperature 98.3 F (36.8 C), temperature source Axillary, resp. rate 20, height 5\' 11"  (1.803 m), weight 68.9 kg (152 lb), SpO2 99 %.Body mass index is 21.2 kg/m.  General Appearance: Casual  Eye Contact:  Minimal  Speech:  non-verbal  Volume:  n/a  Mood:  Irritable  Affect:  Blunt  Thought Process:  Unable to assess  Orientation:  Other:  Unable to assess  Thought Content:  Unable to assess  Suicidal Thoughts:  Unable to assess  Homicidal Thoughts:  Unable to assess  Memory:  Unable to assess  Judgement:  Other:  Unable to assess  Insight:  Unable to assess  Psychomotor  Activity:  Increased  Concentration:  Concentration: Poor and Attention Span: Poor  Recall:  Unable to assess  Fund of Knowledge:  Unable to assess  Language:  Poor  Akathisia:  No  Handed:  Right  AIMS (if indicated):     Assets:  Social Support  ADL's:  Impaired  Cognition:  Impaired,  Moderate  Sleep:   fair      Treatment Plan Summary:  Diagnosis: Autism disorder                    Moderate Intellectual disability Plan: Daily contact with patient to assess and evaluate symptoms and progress in treatment  Medication management:  -Crisis stabilization - Continue medical medications along with Vistaril 25 mg TID for anxiety, Ativan 1 mg TID for anxiety, and Geodon 60 mg BID for mood stabilization, Saphris 5 mg daily PRN agitation, Tenex 2 mg BID for attention, Periactin 4 mg daily for appetite stimulation. -Individual counseling -EDP to evaluate patient for poor appetite -Repeat BMP in am,  Na level on 01/24/17 was 130  Disposition:  24 hour observation, adjusting patient's medications to address agitation, aggression and poor appetite. Re-evaluate patient tomorrow 01/30/17  Thedore MinsAkintayo, Quinteria Chisum, MD 01/29/2017, 11:05 AM

## 2017-01-29 NOTE — ED Notes (Signed)
Patient anxious, but remains cooperative with care at this time and allowed staff to get his vitals. Patient urinated large amount in his brief which was changed. Patient offered snack, but declined. No agitation noted.

## 2017-01-29 NOTE — ED Notes (Signed)
TTS AT BEDSIDE 

## 2017-01-30 DIAGNOSIS — F419 Anxiety disorder, unspecified: Secondary | ICD-10-CM | POA: Diagnosis not present

## 2017-01-30 DIAGNOSIS — R450 Nervousness: Secondary | ICD-10-CM | POA: Diagnosis not present

## 2017-01-30 DIAGNOSIS — R413 Other amnesia: Secondary | ICD-10-CM | POA: Diagnosis not present

## 2017-01-30 DIAGNOSIS — F840 Autistic disorder: Secondary | ICD-10-CM | POA: Diagnosis not present

## 2017-01-30 LAB — VITAMIN D 25 HYDROXY (VIT D DEFICIENCY, FRACTURES): Vit D, 25-Hydroxy: 17.8 ng/mL — ABNORMAL LOW (ref 30.0–100.0)

## 2017-01-30 MED ORDER — ZIPRASIDONE HCL 60 MG PO CAPS: 60.0000 mg | ORAL_CAPSULE | Freq: Two times a day (BID) | ORAL | 0 refills | Status: DC

## 2017-01-30 MED ORDER — CYPROHEPTADINE HCL 4 MG PO TABS: 4.0000 mg | ORAL_TABLET | Freq: Two times a day (BID) | ORAL | 0 refills | Status: DC

## 2017-01-30 MED ORDER — ASENAPINE MALEATE 5 MG SL SUBL: 5.0000 mg | SUBLINGUAL_TABLET | Freq: Every day | SUBLINGUAL | 0 refills | Status: DC | PRN

## 2017-01-30 MED ORDER — GUANFACINE HCL 2 MG PO TABS: 2.0000 mg | ORAL_TABLET | Freq: Two times a day (BID) | ORAL | 0 refills | Status: AC

## 2017-01-30 MED ORDER — HYDROXYZINE HCL 25 MG PO TABS: 25.0000 mg | ORAL_TABLET | Freq: Three times a day (TID) | ORAL | 0 refills | Status: DC

## 2017-01-30 MED ORDER — LORAZEPAM 2 MG/ML IJ SOLN: 0.5000 mg | Freq: Once | INTRAMUSCULAR | Status: DC

## 2017-01-30 NOTE — Consult Note (Signed)
[facility] Face[facility]to[facility]Face [facility]iat[facility] Consult   Reason for Consult:  Aggressive behavior Referring Physician:  EDP Patient Identification: [name] MRN:  [id number] Principal Diagnosis: Autism disorder Diagnosis:   Patient Active Problem List   Diagnosis Date Noted  . Intellectual disability [F79] 01/28/2017  . Onychomycosis [B35.1] 10/29/2014  . Dystrophic nail [L60.3] 04/24/2013  . Pain in lower limb [M79.606] 04/24/2013  . Autism disorder [F84.0]   . ADHD (attention deficit hyperactivity disorder) [F90.9]     Total Time spent with patient: 30 minutes  Subjective:   [name] is a 27 y.o. male patient admitted with Aggressive behavior related to autism disorder.  HPI:  Pt was seen and chart reviewed with treatment team and Dr Sharma CovertNorman. Pt lives with his foster mother for the past 13 years. Pt became very aggressive at home and [facility] and [facility]. Medication changes were implemented and Pt has stabilized. Pt is psychiatrically clear for discharge.   Past Psychiatric History: As above  Risk to Self: None Risk to Others: None Prior Inpatient Therapy: Prior Inpatient Therapy: No Prior Outpatient Therapy: Prior Outpatient Therapy: No Does patient have an ACCT team?: No Does patient have Intensive In-House Services?  : No Does patient have [facility] services? : No Does patient have P4CC services?: No  Past Medical History:  Past Medical History:  Diagnosis Date  . ADHD (attention deficit hyperactivity disorder)   . Autism   . Mental retardation    History reviewed. No pertinent surgical history. Family History: History reviewed. No pertinent family history. Family Psychiatric  History: Unknown Social History:  Social History   Substance and Sexual Activity  Alcohol Use No  . Alcohol/week: 0.0 oz     Social History   Substance and Sexual Activity  Drug Use No    Social History   Socioeconomic History  . Marital status:  Single    Spouse name: None  . Number of children: None  . Years of education: None  . Highest education level: None  Social Needs  . Financial resource strain: None  . Food insecurity - worry: None  . Food insecurity - inability: None  . Transportation needs - medical: None  . Transportation needs - non-medical: None  Occupational History  . None  Tobacco Use  . Smoking status: Never Smoker  . Smokeless tobacco: Never Used  Substance and Sexual Activity  . Alcohol use: No    Alcohol/week: 0.0 oz  . Drug use: No  . Sexual activity: None  Other Topics Concern  . None  Social History Narrative  . None   Additional Social History: N/A    Allergies:  No Known Allergies  Labs: No results found for this or any previous visit (from the past 48 hour(s)).  Current Facility-Administered Medications  Medication Dose Route Frequency Provider Last Rate Last Dose  . benzonatate (TESSALON) capsule 100 mg  100 mg Oral BID Lorre NickAllen, Anthony, MD   100 mg at 01/30/17 1103  . ciclopirox (PENLAC) 8 % solution   Topical QHS Lorre NickAllen, Anthony, MD      . cyproheptadine (PERIACTIN) 4 MG tablet 4 mg  4 mg Oral BID Jannifer FranklinAkintayo, Mojeed, MD   4 mg at 01/30/17 1108  . feeding supplement (ENSURE ENLIVE) (ENSURE ENLIVE) liquid 237 mL  237 mL Oral BID BM Bethann BerkshireZammit, Joseph, MD   237 mL at 01/30/17 1105  . fluticasone (FLONASE) 50 MCG/ACT nasal spray 1-2 spray  1-2 spray Each Nare Daily Lorre NickAllen, Anthony, MD  2 spray at 01/30/17 1100  . guanFACINE (TENEX) tablet 2 mg  2 mg Oral BID [name], Mojeed, MD   2 mg at 01/30/17 1101  . hydrOXYzine (ATARAX/VISTARIL) tablet 25 mg  25 mg Oral TID Thedore Mins, MD   25 mg at 01/30/17 1103  . loratadine (CLARITIN) tablet 10 mg  10 mg Oral Daily Lorre Nick, MD   10 mg at 01/30/17 1104  . LORazepam (ATIVAN) injection 0.5 mg  0.5 mg Intravenous Once Pollina, Canary Brim, MD      . LORazepam (ATIVAN) tablet 1 mg  1 mg Oral TID Thedore Mins, MD   1 mg at 01/30/17 1102  .  pantoprazole (PROTONIX) EC tablet 40 mg  40 mg Oral Daily Lorre Nick, MD   40 mg at 01/30/17 1104  . polyethylene glycol (MIRALAX / GLYCOLAX) packet 17 g  17 g Oral Daily Lorre Nick, MD   17 g at 01/30/17 1105  . ziprasidone (GEODON) capsule 60 mg  60 mg Oral BID WC Akintayo, Mojeed, MD   60 mg at 01/30/17 1102   Current Outpatient Medications  Medication Sig Dispense Refill  . benzonatate (TESSALON) 100 MG capsule Take 100 mg by mouth 2 (two) times daily.    . cetirizine (ZYRTEC) 10 MG tablet Take 10 mg by mouth daily.     . ciclopirox (PENLAC) 8 % solution Apply topically at bedtime. Apply over nail and surrounding skin. Apply daily over previous coat. After seven (7) days, may remove with alcohol and continue cycle. 6.6 mL 6  . clindamycin-benzoyl peroxide (BENZACLIN) gel Apply 1 application topically 2 (two) times daily.     . fluticasone (FLONASE) 50 MCG/ACT nasal spray Place 1-2 sprays into both nostrils daily.    Marland Kitchen omeprazole (PRILOSEC) 20 MG capsule Take 20 mg by mouth 2 (two) times daily before a meal.    . polyethylene glycol powder (GLYCOLAX/MIRALAX) powder Take 17 g by mouth daily.    Marland Kitchen asenapine (SAPHRIS) 5 MG SUBL 24 hr tablet Place 1 tablet (5 mg total) under the tongue daily as needed (agitation). 30 tablet 0  . cyproheptadine (PERIACTIN) 4 MG tablet Take 1 tablet (4 mg total) by mouth 2 (two) times daily. 60 tablet 0  . guanFACINE (TENEX) 2 MG tablet Take 1 tablet (2 mg total) by mouth 2 (two) times daily. 60 tablet 0  . hydrOXYzine (ATARAX/VISTARIL) 25 MG tablet Take 1 tablet (25 mg total) by mouth 3 (three) times daily. 60 tablet 0  . hydrOXYzine (ATARAX/VISTARIL) 25 MG tablet Take 1 tablet (25 mg total) by mouth 3 (three) times daily. 30 tablet 0  . LORazepam (ATIVAN) 1 MG tablet Take 1 tablet (1 mg total) by mouth 3 (three) times daily. 30 tablet 0  . ziprasidone (GEODON) 60 MG capsule Take 1 capsule (60 mg total) by mouth 2 (two) times daily with a meal. 60 capsule 0   . ziprasidone (GEODON) 60 MG capsule Take 1 capsule (60 mg total) by mouth 2 (two) times daily with a meal. 60 capsule 0    Musculoskeletal: Strength & Muscle Tone: within normal limits Gait & Station: normal Patient leans: N/A  Psychiatric Specialty Exam: Physical Exam  Nursing note and vitals reviewed. Constitutional: He appears well-developed and well-nourished.  HENT:  Head: Normocephalic.  Neck: Normal range of motion.  Respiratory: Effort normal.  Musculoskeletal: Normal range of motion.  Neurological: He is alert.  Skin: No rash noted.  Psychiatric: His mood appears anxious. He is agitated. Cognition and  memory are impaired. He expresses impulsivity.    Review of Systems  Psychiatric/Behavioral: Positive for memory loss (autism and IDD). Negative for depression, hallucinations, substance abuse and suicidal ideas. The patient is nervous/anxious. The patient does not have insomnia.   All other systems reviewed and are negative.   Blood pressure 125/73, pulse (!) 110, temperature 98.6 F (37 C), temperature source Axillary, resp. rate 20, height 5\' 11"  (1.803 m), weight 68.9 kg (152 lb), SpO2 99 %.Body mass index is 21.2 kg/m.  General Appearance: Casual  Eye Contact:  Fair  Speech:  non verbal  Volume:  non verbal  Mood: Appears Irritable  Affect:  Congruent  Thought Process:  Disorganized  Orientation:  Other:  person  Thought Content:  autism, non verbal  Suicidal Thoughts:  UTA since nonverbal but no aggressive behaviors towards self observed today.  Homicidal Thoughts:  UTA since nonverbal but no aggressive behaviors towards others observed today.  Memory: UTA since patient is nonverbal.  Judgement:  Poor  Insight:  Lacking  Psychomotor Activity:  Normal  Concentration:  Concentration: Poor and Attention Span: Poor  Recall:  UTA since patient is nonverbal.  Fund of Knowledge:  Poor  Language:  UTA since patient is nonverbal.  Akathisia:  UTA since patient is  nonverbal.  Handed:  Right  AIMS (if indicated):   N/A  Assets:  Financial Resources/Insurance Housing  ADL's:  Impaired  Cognition:  WNL and Impaired,  Severe  Sleep:   N/A     Treatment Plan Summary: Plan Autism disorder  [facility] Take all medications as prescribed  Disposition: No evidence of imminent risk to self or others at present.   Patient does not meet criteria for psychiatric inpatient admission. Supportive therapy provided about ongoing stressors. Discussed crisis plan, support from social network, calling 911, coming to the Emergency Department, and calling Suicide Hotline.  Laveda Abbe, NP 01/30/2017 11:26 AM   Patient seen face-to-face for psychiatric evaluation, chart reviewed and case discussed with the physician extender and developed treatment plan. Reviewed the information documented and agree with the treatment plan.  Juanetta Beets, DO 01/30/17 10:44 PM

## 2017-01-30 NOTE — [facility] Suicide Risk Assessment (Signed)
Suicide Risk Assessment  Discharge Assessment   [facility]   Principal Problem: Autism disorder Discharge Diagnoses:  Patient Active Problem List   Diagnosis Date Noted  . Intellectual disability [F79] 01/28/2017  . Onychomycosis [B35.1] 10/29/2014  . Dystrophic nail [L60.3] 04/24/2013  . Pain in lower limb [M79.606] 04/24/2013  . Autism disorder [F84.0]   . ADHD (attention deficit hyperactivity disorder) [F90.9]     Total Time spent with patient: 30 minutes  Musculoskeletal: Strength & Muscle Tone: within normal limits Gait & Station: normal Patient leans: N/A  Psychiatric Specialty Exam: Physical Exam  Constitutional: He appears well-developed and well-nourished.  HENT:  Head: Normocephalic.  Respiratory: Effort normal.  Musculoskeletal: Normal range of motion.  Neurological: He is alert.  Psychiatric: His mood appears anxious. He is agitated. Cognition and memory are impaired. He expresses impulsivity.   Review of Systems  Psychiatric/Behavioral: Positive for memory loss (autism and IDD). Negative for depression, hallucinations, substance abuse and suicidal ideas. The patient is nervous/anxious. The patient does not have insomnia.   All other systems reviewed and are negative.  Blood pressure 125/73, pulse (!) 110, temperature 98.6 F (37 C), temperature source Axillary, resp. rate 20, height 5\' 11"  (1.803 m), weight 68.9 kg (152 lb), SpO2 99 %.Body mass index is 21.2 kg/m. General Appearance: Casual Eye Contact:  Fair Speech:  non verbal Volume:  non verbal Mood:  Irritable Affect:  Congruent Thought Process:  Disorganized Orientation:  Other:  person Thought Content:  autism, non verbal Suicidal Thoughts:  No Homicidal Thoughts:  No Memory:  Immediate;   Fair Recent;   Fair Remote;   Poor Judgement:  Poor Insight:  Lacking Psychomotor Activity:  Normal Concentration:  Concentration: Poor and Attention Span: Poor Recall:   Poor Fund of Knowledge:  Poor Language:  Poor Akathisia:  No Handed:  Right AIMS (if indicated):    Assets:  Financial Resources/Insurance Housing ADL's:  Impaired Cognition:  WNL and Impaired,  Severe   Mental Status Per Nursing Assessment::   On Admission:   Aggressive behavior  Demographic Factors:  Adolescent or young adult  Loss Factors: NA  Historical Factors: Impulsivity  Risk Reduction Factors:   Living with another person, especially a relative  Continued Clinical Symptoms:  Depression:   Aggression Impulsivity  Cognitive Features That Contribute To Risk:  Loss of executive function    Suicide Risk:  Minimal: No identifiable suicidal ideation.  Patients presenting with no risk factors but with morbid ruminations; may be classified as minimal risk based on the severity of the depressive symptoms    Plan Of Care/Follow-up recommendations:  Activity:  as tolerated Diet:  Heart Healthy  Laveda AbbeLaurie Britton Kashton Mcartor, NP 01/30/2017, 11:27 AM

## 2017-01-30 NOTE — ED Provider Notes (Signed)
Asked to evaluate patient by nursing staff.  He had been doing well all shift and then suddenly began crying.  Encountered patient in his room.  He was crying loudly, but was consolable.  Examination is unremarkable.  Abdominal exam benign, no evidence of tenderness.  Breathing comfortably.  Vital signs unremarkable.  Patient appears well, will give Ativan to help calm him down.   Gilda CreasePollina, Lakeyshia Tuckerman J, MD 01/30/17 [phone]

## 2017-03-02 ENCOUNTER — Encounter: Payer: Self-pay | Admitting: Podiatry

## 2017-03-02 ENCOUNTER — Ambulatory Visit (INDEPENDENT_AMBULATORY_CARE_PROVIDER_SITE_OTHER): Payer: Medicaid Other | Admitting: Podiatry

## 2017-03-02 DIAGNOSIS — M79672 Pain in left foot: Secondary | ICD-10-CM | POA: Diagnosis not present

## 2017-03-02 DIAGNOSIS — B351 Tinea unguium: Secondary | ICD-10-CM

## 2017-03-02 DIAGNOSIS — L603 Nail dystrophy: Secondary | ICD-10-CM | POA: Diagnosis not present

## 2017-03-02 DIAGNOSIS — M79671 Pain in right foot: Secondary | ICD-10-CM | POA: Diagnosis not present

## 2017-03-02 NOTE — Progress Notes (Signed)
Subjective: 27 y.o. year old male patient presents accompanied by his care taker for follow up on fungal nails. His nails been treated with Penlac daily application.  Objective: Dermatologic: Thick yellow deformed nails on both great toes. No changes seen in thickness of the nails. Vascular: Pedal pulses are all palpable. Orthopedic: No abnormal findings. Neurologic: All epicritic and tactile sensations grossly intact.  Assessment: Dystrophic mycotic nails x both great toes.  Treatment: All mycotic nails debrided.  Advised to continue with Penlac treatment. Return in 3 months or as needed.

## 2017-03-02 NOTE — Patient Instructions (Addendum)
Seen for hypertrophic nails. All nails debrided. Continue with Penlac treatment. Return in 3 months or as needed.

## 2017-05-30 ENCOUNTER — Ambulatory Visit (INDEPENDENT_AMBULATORY_CARE_PROVIDER_SITE_OTHER): Payer: Medicaid Other | Admitting: Podiatry

## 2017-05-30 ENCOUNTER — Encounter: Payer: Self-pay | Admitting: Podiatry

## 2017-05-30 DIAGNOSIS — M79671 Pain in right foot: Secondary | ICD-10-CM

## 2017-05-30 DIAGNOSIS — M79672 Pain in left foot: Secondary | ICD-10-CM

## 2017-05-30 DIAGNOSIS — B351 Tinea unguium: Secondary | ICD-10-CM | POA: Diagnosis not present

## 2017-05-30 DIAGNOSIS — L603 Nail dystrophy: Secondary | ICD-10-CM

## 2017-05-30 NOTE — Patient Instructions (Signed)
Seen for hypertrophic nails. All nails debrided. Continue with Penlac daily. Return in 3 months or as needed.

## 2017-05-30 NOTE — Progress Notes (Signed)
Subjective: 27 y.o. year old male patient presents accompanied by a care taker for follow up on Fungal nails. He is currently using Penlac daily.  Objective: Dermatologic: Thick yellow deformed nails both great toes with severe deformity. Vascular: Pedal pulses are all palpable. Orthopedic: No abnormal findings. Neurologic: All epicritic and tactile sensations grossly intact.  Assessment: Dystrophic mycotic nails both great toes.  Treatment: All mycotic nails debrided and both thick great toe nail grinded down..  Continue with Penlac daily. Return in 3 months or as needed.

## 2017-08-25 ENCOUNTER — Encounter (HOSPITAL_COMMUNITY)
Admission: RE | Disposition: A | Discharge status: Home / Self Care | Payer: Self-pay | Source: Ambulatory Visit | Attending: Ophthalmology

## 2017-08-25 ENCOUNTER — Other Ambulatory Visit: Payer: Self-pay

## 2017-08-25 ENCOUNTER — Ambulatory Visit (HOSPITAL_COMMUNITY)
Admission: RE | Admit: 2017-08-25 | Discharge: 2017-08-25 | Disposition: A | Discharge status: Home / Self Care | Payer: Medicaid Other | Source: Ambulatory Visit | Attending: Ophthalmology | Admitting: Ophthalmology

## 2017-08-25 ENCOUNTER — Ambulatory Visit (HOSPITAL_COMMUNITY): Payer: Medicaid Other | Admitting: Certified Registered Nurse Anesthetist

## 2017-08-25 ENCOUNTER — Encounter (HOSPITAL_COMMUNITY): Payer: Self-pay | Admitting: *Deleted

## 2017-08-25 DIAGNOSIS — H5712 Ocular pain, left eye: Secondary | ICD-10-CM | POA: Diagnosis present

## 2017-08-25 DIAGNOSIS — F901 Attention-deficit hyperactivity disorder, predominantly hyperactive type: Secondary | ICD-10-CM | POA: Insufficient documentation

## 2017-08-25 DIAGNOSIS — X58XXXA Exposure to other specified factors, initial encounter: Secondary | ICD-10-CM | POA: Diagnosis not present

## 2017-08-25 DIAGNOSIS — S0592XA Unspecified injury of left eye and orbit, initial encounter: Secondary | ICD-10-CM

## 2017-08-25 DIAGNOSIS — F840 Autistic disorder: Secondary | ICD-10-CM | POA: Diagnosis not present

## 2017-08-25 DIAGNOSIS — S0522XA Ocular laceration and rupture with prolapse or loss of intraocular tissue, left eye, initial encounter: Secondary | ICD-10-CM | POA: Diagnosis not present

## 2017-08-25 HISTORY — PX: RUPTURED GLOBE EXPLORATION AND REPAIR: SHX2366

## 2017-08-25 SURGERY — REPAIR, RUPTURE, GLOBE
Anesthesia: General | Site: Eye | Laterality: Left

## 2017-08-25 MED ORDER — TOBRAMYCIN-DEXAMETHASONE 0.3-0.1 % OP SUSP
OPHTHALMIC | Status: DC | PRN
  Administered 2017-08-25: 1 [drp] via OPHTHALMIC

## 2017-08-25 MED ORDER — GENTAMICIN SULFATE 40 MG/ML IJ SOLN
INTRAMUSCULAR | Status: AC
  Filled 2017-08-25: qty 2

## 2017-08-25 MED ORDER — MIDAZOLAM HCL 2 MG/2ML IJ SOLN
INTRAMUSCULAR | Status: AC
  Filled 2017-08-25: qty 2

## 2017-08-25 MED ORDER — PROPOFOL 10 MG/ML IV BOLUS
INTRAVENOUS | Status: DC | PRN
  Administered 2017-08-25: 90 mg via INTRAVENOUS

## 2017-08-25 MED ORDER — LIDOCAINE 2% (20 MG/ML) 5 ML SYRINGE
INTRAMUSCULAR | Status: DC | PRN
  Administered 2017-08-25: 20 mg via INTRAVENOUS

## 2017-08-25 MED ORDER — FENTANYL CITRATE (PF) 100 MCG/2ML IJ SOLN: 25.0000 ug | INTRAMUSCULAR | Status: DC | PRN

## 2017-08-25 MED ORDER — BSS IO SOLN
INTRAOCULAR | Status: AC
  Filled 2017-08-25: qty 15

## 2017-08-25 MED ORDER — EPHEDRINE 5 MG/ML INJ
INTRAVENOUS | Status: AC
  Filled 2017-08-25: qty 10

## 2017-08-25 MED ORDER — NA CHONDROIT SULF-NA HYALURON 40-30 MG/ML IO SOLN
INTRAOCULAR | Status: AC
  Filled 2017-08-25: qty 0.5

## 2017-08-25 MED ORDER — DEXAMETHASONE SODIUM PHOSPHATE 10 MG/ML IJ SOLN
INTRAMUSCULAR | Status: DC | PRN
  Administered 2017-08-25: 5 mg via INTRAVENOUS

## 2017-08-25 MED ORDER — OXYCODONE HCL 5 MG PO TABS: 5.0000 mg | ORAL_TABLET | Freq: Once | ORAL | Status: DC | PRN

## 2017-08-25 MED ORDER — DEXAMETHASONE SODIUM PHOSPHATE 10 MG/ML IJ SOLN
INTRAMUSCULAR | Status: AC
  Filled 2017-08-25: qty 1

## 2017-08-25 MED ORDER — TOBRAMYCIN-DEXAMETHASONE 0.3-0.1 % OP OINT
TOPICAL_OINTMENT | OPHTHALMIC | Status: AC
  Filled 2017-08-25: qty 3.5

## 2017-08-25 MED ORDER — SODIUM CHLORIDE 0.9 % IR SOLN
Status: DC | PRN
  Administered 2017-08-25: 1000 mL

## 2017-08-25 MED ORDER — TOBRAMYCIN SULFATE 80 MG/2ML IJ SOLN
INTRAMUSCULAR | Status: DC | PRN
  Administered 2017-08-25: 80 mg

## 2017-08-25 MED ORDER — PHENYLEPHRINE 40 MCG/ML (10ML) SYRINGE FOR IV PUSH (FOR BLOOD PRESSURE SUPPORT)
PREFILLED_SYRINGE | INTRAVENOUS | Status: DC | PRN
  Administered 2017-08-25: 80 ug via INTRAVENOUS

## 2017-08-25 MED ORDER — LIDOCAINE 2% (20 MG/ML) 5 ML SYRINGE
INTRAMUSCULAR | Status: AC
  Filled 2017-08-25: qty 5

## 2017-08-25 MED ORDER — OXYCODONE HCL 5 MG/5ML PO SOLN: 5.0000 mg | Freq: Once | ORAL | Status: DC | PRN

## 2017-08-25 MED ORDER — DEXAMETHASONE SODIUM PHOSPHATE 10 MG/ML IJ SOLN
INTRAMUSCULAR | Status: DC | PRN
  Administered 2017-08-25: 10 mg

## 2017-08-25 MED ORDER — HYPROMELLOSE (GONIOSCOPIC) 2.5 % OP SOLN
OPHTHALMIC | Status: AC
  Filled 2017-08-25: qty 15

## 2017-08-25 MED ORDER — BSS PLUS IO SOLN
INTRAOCULAR | Status: AC
  Filled 2017-08-25: qty 500

## 2017-08-25 MED ORDER — FENTANYL CITRATE (PF) 250 MCG/5ML IJ SOLN
INTRAMUSCULAR | Status: DC | PRN
  Administered 2017-08-25: 100 ug via INTRAVENOUS
  Administered 2017-08-25: 25 ug via INTRAVENOUS

## 2017-08-25 MED ORDER — SODIUM HYALURONATE 10 MG/ML IO SOLN
INTRAOCULAR | Status: DC | PRN
  Administered 2017-08-25: 0.85 mL via INTRAOCULAR

## 2017-08-25 MED ORDER — PHENYLEPHRINE 40 MCG/ML (10ML) SYRINGE FOR IV PUSH (FOR BLOOD PRESSURE SUPPORT)
PREFILLED_SYRINGE | INTRAVENOUS | Status: AC
  Filled 2017-08-25: qty 10

## 2017-08-25 MED ORDER — SODIUM HYALURONATE 10 MG/ML IO SOLN
INTRAOCULAR | Status: AC
  Filled 2017-08-25: qty 0.85

## 2017-08-25 MED ORDER — CEFAZOLIN SODIUM-DEXTROSE 2-4 GM/100ML-% IV SOLN
2.0000 g | Freq: Once | INTRAVENOUS | Status: AC
  Administered 2017-08-25: 2 g via INTRAVENOUS
  Filled 2017-08-25: qty 100

## 2017-08-25 MED ORDER — TOBRAMYCIN SULFATE 80 MG/2ML IJ SOLN
INTRAMUSCULAR | Status: AC
  Filled 2017-08-25: qty 2

## 2017-08-25 MED ORDER — ROCURONIUM BROMIDE 10 MG/ML (PF) SYRINGE
PREFILLED_SYRINGE | INTRAVENOUS | Status: DC | PRN
  Administered 2017-08-25: 50 mg via INTRAVENOUS

## 2017-08-25 MED ORDER — LACTATED RINGERS IV SOLN
INTRAVENOUS | Status: DC | PRN
  Administered 2017-08-25: 18:00:00 via INTRAVENOUS

## 2017-08-25 MED ORDER — BSS IO SOLN
INTRAOCULAR | Status: DC | PRN
  Administered 2017-08-25: 15 mL via INTRAOCULAR

## 2017-08-25 MED ORDER — TETRACAINE HCL 0.5 % OP SOLN
OPHTHALMIC | Status: AC
  Filled 2017-08-25: qty 4

## 2017-08-25 MED ORDER — NA CHONDROIT SULF-NA HYALURON 40-30 MG/ML IO SOLN
INTRAOCULAR | Status: DC | PRN
  Administered 2017-08-25: 0.5 mL via INTRAOCULAR

## 2017-08-25 MED ORDER — ONDANSETRON HCL 4 MG/2ML IJ SOLN
INTRAMUSCULAR | Status: DC | PRN
  Administered 2017-08-25: 4 mg via INTRAVENOUS

## 2017-08-25 MED ORDER — CEFAZOLIN SODIUM-DEXTROSE 2-4 GM/100ML-% IV SOLN
INTRAVENOUS | Status: AC
  Filled 2017-08-25: qty 100

## 2017-08-25 MED ORDER — MIDAZOLAM HCL 5 MG/5ML IJ SOLN
INTRAMUSCULAR | Status: DC | PRN
  Administered 2017-08-25: 2 mg via INTRAVENOUS

## 2017-08-25 MED ORDER — NEOMYCIN-POLYMYXIN-DEXAMETH 3.5-10000-0.1 OP OINT
TOPICAL_OINTMENT | OPHTHALMIC | Status: AC
  Filled 2017-08-25: qty 3.5

## 2017-08-25 MED ORDER — ONDANSETRON HCL 4 MG/2ML IJ SOLN: 4.0000 mg | Freq: Four times a day (QID) | INTRAMUSCULAR | Status: DC | PRN

## 2017-08-25 MED ORDER — SUCCINYLCHOLINE CHLORIDE 200 MG/10ML IV SOSY
PREFILLED_SYRINGE | INTRAVENOUS | Status: AC
  Filled 2017-08-25: qty 10

## 2017-08-25 MED ORDER — EPINEPHRINE PF 1 MG/ML IJ SOLN
INTRAMUSCULAR | Status: AC
  Filled 2017-08-25: qty 1

## 2017-08-25 MED ORDER — FENTANYL CITRATE (PF) 250 MCG/5ML IJ SOLN
INTRAMUSCULAR | Status: AC
  Filled 2017-08-25: qty 5

## 2017-08-25 MED ORDER — ATROPINE SULFATE 1 % OP SOLN
OPHTHALMIC | Status: AC
  Filled 2017-08-25: qty 5

## 2017-08-25 MED ORDER — PROPOFOL 10 MG/ML IV BOLUS
INTRAVENOUS | Status: AC
  Filled 2017-08-25: qty 20

## 2017-08-25 SURGICAL SUPPLY — 29 items
APL SRG 3 HI ABS STRL LF PLS (MISCELLANEOUS) ×1
APPLICATOR DR MATTHEWS STRL (MISCELLANEOUS) ×2 IMPLANT
CANNULA ANT CHAM MAIN (OPHTHALMIC RELATED) IMPLANT
CORDS BIPOLAR (ELECTRODE) ×2 IMPLANT
ERASER HMR WETFIELD 23G BP (MISCELLANEOUS) ×2 IMPLANT
GLOVE SURG SIGNA 7.5 PF LTX (GLOVE) ×2 IMPLANT
GOWN STRL REUS W/ TWL LRG LVL3 (GOWN DISPOSABLE) ×2 IMPLANT
GOWN STRL REUS W/TWL LRG LVL3 (GOWN DISPOSABLE) ×4
KIT BASIN OR (CUSTOM PROCEDURE TRAY) ×2 IMPLANT
KIT TURNOVER KIT B (KITS) ×2 IMPLANT
NS IRRIG 1000ML POUR BTL (IV SOLUTION) ×2 IMPLANT
PACK CATARACT CUSTOM (CUSTOM PROCEDURE TRAY) ×2 IMPLANT
PAD ARMBOARD 7.5X6 YLW CONV (MISCELLANEOUS) ×4 IMPLANT
PAK VITRECTOMY PIK  23GA (OPHTHALMIC RELATED) IMPLANT
SPEAR EYE SURG WECK-CEL (MISCELLANEOUS) ×6 IMPLANT
STRIP CLOSURE SKIN 1/2X4 (GAUZE/BANDAGES/DRESSINGS) ×2 IMPLANT
SUT ETHILON 10 0 CS140 6 (SUTURE) ×2 IMPLANT
SUT ETHILON 8 0 TG100 8 (SUTURE) ×1 IMPLANT
SUT ETHILON 9 0 TG140 8 (SUTURE) ×2 IMPLANT
SUT MERSILENE 4 0 S 24 (SUTURE) IMPLANT
SUT SILK 4 0 RB 1 (SUTURE) IMPLANT
SUT VICRYL 7 0 TG140 8 (SUTURE) IMPLANT
SUT VICRYL 8 0 TG140 8 (SUTURE) IMPLANT
SUT VICRYL 9-0 (SUTURE) IMPLANT
SYR TB 1ML LUER SLIP (SYRINGE) ×2 IMPLANT
TOWEL OR 17X24 6PK STRL BLUE (TOWEL DISPOSABLE) ×4 IMPLANT
VITCUTTER PED ULTRA 25G SHORT (OPHTHALMIC) IMPLANT
WATER STERILE IRR 1000ML POUR (IV SOLUTION) ×2 IMPLANT
WIPE INSTRUMENT VISIWIPE 73X73 (MISCELLANEOUS) IMPLANT

## 2017-08-25 NOTE — Discharge Instructions (Signed)
D/C to home Post VSS:   F/U @[facility] in AM

## 2017-08-25 NOTE — Brief Op Note (Signed)
08/25/2017  9:12 PM  PATIENT:  [name]  27 y.o. male  PRE-OPERATIVE DIAGNOSIS:  ruptured globe left eye  POST-OPERATIVE DIAGNOSIS:  ruptured globe left eye  PROCEDURE:  Procedure(s): REPAIR OF RUPTURED GLOBE (Left)  SURGEON:  Surgeon(s) and Role:    Aura Camps* Griffith Santilli, MD - Primary  PHYSICIAN ASSISTANT:   ASSISTANTS: none   ANESTHESIA:   general  EBL:  20 mL   BLOOD ADMINISTERED:none  DRAINS: none   LOCAL MEDICATIONS USED:  NONE  SPECIMEN:  No Specimen  DISPOSITION OF SPECIMEN:  N/A  COUNTS:  YES  TOURNIQUET:  * No tourniquets in log *  DICTATION: .Other Dictation: Dictation Number [id number]  PLAN OF CARE: Discharge to home after PACU  PATIENT DISPOSITION:  PACU - hemodynamically stable.   Delay start of Pharmacological VTE agent (>24hrs) due to surgical blood loss or risk of bleeding: yes

## 2017-08-25 NOTE — Transfer of Care (Signed)
Immediate Anesthesia Transfer of Care Note  Patient: Joel StarrCharlie Huizenga  Procedure(s) Performed: REPAIR OF RUPTURED GLOBE (Left Eye)  Patient Location: PACU  Anesthesia Type:General  Level of Consciousness: awake, alert  and patient cooperative  Airway & Oxygen Therapy: Patient Spontanous Breathing and Patient connected to nasal cannula oxygen  Post-op Assessment: Report given to RN, Post -op Vital signs reviewed and stable and Patient moving all extremities  Post vital signs: Reviewed and stable  Last Vitals:  Vitals Value Taken Time  BP 128/75 08/25/2017  9:09 PM  Temp    Pulse 103 08/25/2017  9:10 PM  Resp 17 08/25/2017  9:10 PM  SpO2 99 % 08/25/2017  9:10 PM  Vitals shown include unvalidated device data.  Last Pain:  Vitals:   08/25/17 1600  TempSrc:   PainSc: 0-No pain         Complications: No apparent anesthesia complications

## 2017-08-25 NOTE — H&P (Signed)
Terisa StarrCharlie Vecchio is an 27 y.o. male.   Chief Complaint: Eye pain loss of Vision post left eye trauma . HPI: 27 y/o asian male c autism and intellectual development delay presents for emergent Exam under anesthesia and repair of open globe os . Pt;s guardian reports inadvertent eye trauma while at vacationing [place] the beach.  Past Medical History:  Diagnosis Date  . ADHD (attention deficit hyperactivity disorder)   . Autism   . Mental retardation     No past surgical history on file.  No family history on file. Social History:  reports that he has never smoked. He has never used smokeless tobacco. He reports that he does not drink alcohol or use drugs.  Allergies: No Known Allergies  No medications prior to admission.    No results found for this or any previous visit (from the past 48 hour(s)). No results found.  ROS  There were no vitals taken for this visit. Physical Exam   Assessment/Plan Apparent ruptured globe at SLX os :   EUA c repair of ruptured globe os  Aura CampsMichael Anaka Beazer, MD 08/25/2017, 2:50 PM

## 2017-08-25 NOTE — Anesthesia Procedure Notes (Signed)
Procedure Name: Intubation Date/Time: 08/25/2017 6:19 PM Performed by: Myna Bright, CRNA Pre-anesthesia Checklist: Patient identified, Emergency Drugs available, Suction available and Patient being monitored Patient Re-evaluated:Patient Re-evaluated prior to induction Oxygen Delivery Method: Circle system utilized Preoxygenation: Pre-oxygenation with 100% oxygen Induction Type: IV induction Ventilation: Mask ventilation without difficulty Laryngoscope Size: Mac and 4 Grade View: Grade II Tube type: Oral Tube size: 7.5 mm Number of attempts: 1 Airway Equipment and Method: Stylet Placement Confirmation: ETT inserted through vocal cords under direct vision,  positive ETCO2 and breath sounds checked- equal and bilateral Secured at: 21 cm Tube secured with: Tape Dental Injury: Teeth and Oropharynx as per pre-operative assessment

## 2017-08-25 NOTE — Anesthesia Preprocedure Evaluation (Signed)
Anesthesia Evaluation  Patient identified by MRN, date of birth, ID band Patient awake    Reviewed: Allergy & Precautions, H&P , NPO status , Patient's Chart, lab work & pertinent test results  Airway Mallampati: II   Neck ROM: full    Dental   Pulmonary neg pulmonary ROS,    breath sounds clear to auscultation       Cardiovascular negative cardio ROS   Rhythm:regular Rate:Normal     Neuro/Psych PSYCHIATRIC DISORDERS Autism. ADHD   GI/Hepatic   Endo/Other    Renal/GU      Musculoskeletal   Abdominal   Peds  Hematology   Anesthesia Other Findings   Reproductive/Obstetrics                             Anesthesia Physical Anesthesia Plan  ASA: II  Anesthesia Plan: General   Post-op Pain Management:    Induction: Intravenous  PONV Risk Score and Plan: 2 and Ondansetron, Dexamethasone, Midazolam and Treatment may vary due to age or medical condition  Airway Management Planned: Oral ETT  Additional Equipment:   Intra-op Plan:   Post-operative Plan: Extubation in OR  Informed Consent: I have reviewed the patients History and Physical, chart, labs and discussed the procedure including the risks, benefits and alternatives for the proposed anesthesia with the patient or authorized representative who has indicated his/her understanding and acceptance.     Plan Discussed with: CRNA, Anesthesiologist and Surgeon  Anesthesia Plan Comments:         Anesthesia Quick Evaluation

## 2017-08-25 NOTE — Anesthesia Postprocedure Evaluation (Signed)
Anesthesia Post Note  Patient: [name]  Procedure(s) Performed: REPAIR OF RUPTURED GLOBE (Left Eye)     Patient location during evaluation: PACU Anesthesia Type: General Level of consciousness: awake and alert Pain management: pain level controlled Vital Signs Assessment: post-procedure vital signs reviewed and stable Respiratory status: spontaneous breathing, nonlabored ventilation, respiratory function stable and patient connected to nasal cannula oxygen Cardiovascular status: blood pressure returned to baseline and stable Postop Assessment: no apparent nausea or vomiting Anesthetic complications: no    Last Vitals:  Vitals:   08/25/17 2130 08/25/17 2140  BP: 131/65 (!) 138/92  Pulse:  (!) 112  Resp:    Temp: 36.7 C   SpO2:  99%    Last Pain:  Vitals:   08/25/17 1600  TempSrc:   PainSc: 0-No pain                 Romi Rathel DAVID

## 2017-08-25 NOTE — Interval H&P Note (Signed)
History and Physical Interval Note:  08/25/2017 6:08 PM  [name]  has presented today for surgery, with the diagnosis of ruptured globe left eye  The various methods of treatment have been discussed with the patient and family. After consideration of risks, benefits and other options for treatment, the patient has consented to  Procedure(s): REPAIR OF RUPTURED GLOBE (Left) as a surgical intervention .  The patient's history has been reviewed, patient examined, no change in status, stable for surgery.  I have reviewed the patient's chart and labs.  Questions were answered to the patient's satisfaction.     [name]

## 2017-08-26 ENCOUNTER — Encounter (HOSPITAL_COMMUNITY): Payer: Self-pay | Admitting: Ophthalmology

## 2017-08-26 NOTE — Op Note (Signed)
NAME: [name], [name] MEDICAL RECORD [id number] ACCOUNT [id number] DATE OF BIRTH:[birth year] FACILITY: [facility] LOCATION: [facility] PHYSICIAN:Tamiya Colello Scotty CourtA. Avina Eberle, MD  OPERATIVE REPORT  DATE OF PROCEDURE:  08/25/2017  PREOPERATIVE DIAGNOSIS:  Eye trauma, left eye.  Rule out ruptured globe.  POSTOPERATIVE DIAGNOSIS:  Status post repair of ruptured globe.  SURGEON:  Aura CampsMichael Kito Cuffe, MD  ANESTHESIA:  General with laryngeal mask airway.  INDICATIONS:  The patient is a 27 year old male with autism and intellectual developmental delay who was status post trauma to his left eye resulting in a ruptured globe and loss of vision.  This procedure was indicated to restore the integrity of the  globe and to restore vision to the left eye. The Risks and benefits of the procedure were explained to the patient's parents.  Prior to procedure, informed consent was obtained.  DESCRIPTION OF TECHNIQUE:  The patient was taken into the operating room and placed in the supine position.  The entire face was prepped and draped in the usual sterile fashion. My attention was first directed to the left eye.  A lid speculum was placed,  and the eye was inspected with the operating microscope.  It was found that the patient did have an approximately 8.0 mm laceration of the cornea extending from the supratemproral cornea through the nasal limbus in a medial direction and extending through the sclera towards the area  of the medial rectus tendon with prolapse of iris tissue through the wound.  Next, after inspection fibronous material was excised from the wound edges. The iris was gently reposited into the anterior chamber, using an iris spatula and viscoat was injected via the wound to deepen the anterior chamber.The wound was then  closed from the apex of the laceration towards the limbus.  Prior to the corneal closure, a structural 8-0 Vicryl suture was placed at the corneoscleral limbus at the distal extent of the laceration  to stabilize the globe. Following this suture, the cornea was then closed using interrupted 9-0 Vicryl sutures starting from the apex of the laceration towards the limbus.  Once the cornea was closed,the suture Knots were rotated into the [facility].The iris was carefully reposited during the closure.  My attention was then directed to the limbal closure.  The conjunctiva was dissected free from the limbus and taken down to the level of the sclera for approximately 8 mm following the extent of the scleral laceration, and hemostasis was  achieved with thermal cautery.  The sclera was then closed using interrupted 8-0 nylon sutures.  Once the integrity of the sclera was ascertained, the conjunctiva was then closed over the scleral laceration using interrupted 8-0 Vicryl suture to close  the conjunctiva.  At the conclusion of the procedure, a 15-degree blade was used to make a paracentesis at approximately the 1: o'clock position, and by this paracentesis, the anterior chamber was reformed, irrigating some viscoelastic into the anterior  the anterior chamber.  Next, injections of tobramycin 0.125 mg and Decadron 2 mg were given in the inferior temporal subconjunctival region.  TobraDex ointment was then applied to the eye, and a double pressure patch with a protective Fox shield was then applied. There were no apparent complications. LN/NUANCE  D:08/25/2017 T:08/26/2017 JOB:[id number]

## 2017-08-30 ENCOUNTER — Ambulatory Visit: Payer: Medicaid Other | Admitting: Podiatry

## 2017-09-06 ENCOUNTER — Ambulatory Visit (INDEPENDENT_AMBULATORY_CARE_PROVIDER_SITE_OTHER): Payer: Medicaid Other | Admitting: Podiatry

## 2017-09-06 DIAGNOSIS — L600 Ingrowing nail: Secondary | ICD-10-CM | POA: Diagnosis not present

## 2017-09-06 DIAGNOSIS — M79671 Pain in right foot: Secondary | ICD-10-CM | POA: Diagnosis not present

## 2017-09-06 DIAGNOSIS — B351 Tinea unguium: Secondary | ICD-10-CM

## 2017-09-06 DIAGNOSIS — M79672 Pain in left foot: Secondary | ICD-10-CM

## 2017-09-06 NOTE — Patient Instructions (Signed)
Seen for hypertrophic fungal nails. All nails debrided. Ingrown nails debrided. Bleeding right great toe nail cleansed with Iodine and pressure dressing applied. Continue with daily dressing change in right great toe using Iodine solution. Return in 3 months or sooner if needed.

## 2017-09-07 ENCOUNTER — Encounter: Payer: Self-pay | Admitting: Podiatry

## 2017-09-07 NOTE — Progress Notes (Signed)
Subjective: 27 y.o. year old male patient presents accompanied by a care taker for follow up on Fungal nails.  Has a recent episode of left eye injury and followed by emergency surgery. He is doing well. He is currently using Penlac daily.  Objective: Dermatologic: Thick yellow deformed nails both great toes with severe deformity. Ingrown right great toe nail. Vascular: Pedal pulses are all palpable. Orthopedic: No abnormal findings. Neurologic: All epicritic and tactile sensations grossly intact.  Assessment: Dystrophic mycotic nails both great toes. Ingrown nail right great toe.  Treatment: All mycotic nails debrided. Ingrown right hallucal nail debrided. Betadine dressing applied with home care instruction to use Betadine wet to dry dressing daily.  Continue with Penlac daily. Return in 3 months or as needed.

## 2017-10-02 ENCOUNTER — Telehealth: Payer: Self-pay | Admitting: *Deleted

## 2017-10-02 NOTE — Telephone Encounter (Signed)
[facility] is requesting a refill on Ciclopirox 8% nail [phone])

## 2017-10-24 NOTE — Telephone Encounter (Signed)
Pts guardian called back regarding this request. Please advise.

## 2017-11-24 ENCOUNTER — Other Ambulatory Visit: Payer: Self-pay

## 2017-11-24 ENCOUNTER — Encounter (HOSPITAL_BASED_OUTPATIENT_CLINIC_OR_DEPARTMENT_OTHER): Payer: Self-pay

## 2017-11-24 NOTE — Progress Notes (Signed)
[name] is nonverbal Spoke with:  [name] NPO:  After Midnight, no gum, candy, or mints   Arrival time:  0800AM Labs: N/A AM medications: All morning medications plus eye drops Pre op orders: No, requested 11/24/2017 Ride home: Joel SaunasStacey Fuller [facility] provider) [phone]

## 2017-11-25 NOTE — H&P (Signed)
Terisa StarrCharlie Sadler is an 27 y.o. male.   Chief Complaint: Blurred vision os mild discharge, indwelling corneal sutures . HPI: 27 Y/O Autistic, Asian male s/p repair of ruptured globe os needs EUA to assess healing status of eye and remove corneal sutures .  Past Medical History:  Diagnosis Date  . ADHD (attention deficit hyperactivity disorder)   . Anxiety   . Autism   . GERD (gastroesophageal reflux disease)   . Gilles de la Tourette disorder    History of  . Mental retardation   . Moderate intellectual disabilities   . Nail fungus   . Nonverbal   . Pneumonia 12/2012   Right lower lobe    Past Surgical History:  Procedure Laterality Date  . RUPTURED GLOBE EXPLORATION AND REPAIR Left 08/25/2017   Procedure: REPAIR OF RUPTURED GLOBE;  Surgeon: Aura CampsSpencer, Verena Shawgo, MD;  [facility];  Service: Ophthalmology;  Laterality: Left;    History reviewed. No pertinent family history. Social History:  reports that he has never smoked. He has never used smokeless tobacco. He reports that he does not drink alcohol or use drugs.  Allergies: No Known Allergies  No medications prior to admission.    No results found for this or any previous visit (from the past 48 hour(s)). No results found.  Review of Systems  Constitutional: Negative.   HENT: Negative.   Eyes:       S/p repair of ruptured globe os   Respiratory: Negative.   Cardiovascular: Negative.   Gastrointestinal: Negative.   Genitourinary: Negative.   Musculoskeletal: Negative.   Skin: Negative.   Neurological: Negative.   Endo/Heme/Allergies: Negative.   Psychiatric/Behavioral: Negative.     Height 5\' 11"  (1.803 m), weight 71.7 kg. Physical Exam  Constitutional: He appears well-developed and well-nourished.  HENT:  Head: Normocephalic.  Eyes: Pupils are equal, round, and reactive to light. EOM are normal.    Neck: Normal range of motion.  Cardiovascular: Normal rate.  Respiratory: Effort normal.  Genitourinary: Penis  normal.  Musculoskeletal: Normal range of motion.  Neurological: He is alert.  Skin: Skin is warm.     Assessment/Plan S/P repair of ruptured globe os . EUA for evaluation for biomicroscopic evaluation of anterior segment and possible suture removal os.  Aura CampsMichael Joyclyn Plazola, MD 11/25/2017, 5:51 PM

## 2017-11-29 ENCOUNTER — Ambulatory Visit (HOSPITAL_BASED_OUTPATIENT_CLINIC_OR_DEPARTMENT_OTHER): Payer: Medicaid Other | Admitting: Anesthesiology

## 2017-11-29 ENCOUNTER — Encounter (HOSPITAL_BASED_OUTPATIENT_CLINIC_OR_DEPARTMENT_OTHER)
Admission: RE | Disposition: A | Discharge status: Home / Self Care | Payer: Self-pay | Source: Ambulatory Visit | Attending: Ophthalmology

## 2017-11-29 ENCOUNTER — Ambulatory Visit (HOSPITAL_BASED_OUTPATIENT_CLINIC_OR_DEPARTMENT_OTHER)
Admission: RE | Admit: 2017-11-29 | Discharge: 2017-11-29 | Disposition: A | Discharge status: Home / Self Care | Payer: Medicaid Other | Source: Ambulatory Visit | Attending: Ophthalmology | Admitting: Ophthalmology

## 2017-11-29 ENCOUNTER — Other Ambulatory Visit: Payer: Self-pay

## 2017-11-29 ENCOUNTER — Encounter (HOSPITAL_BASED_OUTPATIENT_CLINIC_OR_DEPARTMENT_OTHER): Payer: Self-pay | Admitting: *Deleted

## 2017-11-29 DIAGNOSIS — F419 Anxiety disorder, unspecified: Secondary | ICD-10-CM | POA: Diagnosis not present

## 2017-11-29 DIAGNOSIS — Z4802 Encounter for removal of sutures: Secondary | ICD-10-CM | POA: Diagnosis not present

## 2017-11-29 DIAGNOSIS — F79 Unspecified intellectual disabilities: Secondary | ICD-10-CM | POA: Diagnosis not present

## 2017-11-29 DIAGNOSIS — H21542 Posterior synechiae (iris), left eye: Secondary | ICD-10-CM | POA: Insufficient documentation

## 2017-11-29 DIAGNOSIS — F909 Attention-deficit hyperactivity disorder, unspecified type: Secondary | ICD-10-CM | POA: Diagnosis not present

## 2017-11-29 DIAGNOSIS — H268 Other specified cataract: Secondary | ICD-10-CM | POA: Diagnosis not present

## 2017-11-29 DIAGNOSIS — K219 Gastro-esophageal reflux disease without esophagitis: Secondary | ICD-10-CM | POA: Diagnosis not present

## 2017-11-29 DIAGNOSIS — F952 Tourette's disorder: Secondary | ICD-10-CM | POA: Diagnosis not present

## 2017-11-29 DIAGNOSIS — F840 Autistic disorder: Secondary | ICD-10-CM | POA: Diagnosis not present

## 2017-11-29 HISTORY — DX: Moderate intellectual disabilities: F71

## 2017-11-29 HISTORY — DX: Anxiety disorder, unspecified: F41.9

## 2017-11-29 HISTORY — DX: Tinea unguium: B35.1

## 2017-11-29 HISTORY — PX: EYE EXAMINATION UNDER ANESTHESIA: SHX1560

## 2017-11-29 HISTORY — DX: Tourette's disorder: F95.2

## 2017-11-29 HISTORY — DX: Gastro-esophageal reflux disease without esophagitis: K21.9

## 2017-11-29 HISTORY — DX: Pneumonia, unspecified organism: J18.9

## 2017-11-29 HISTORY — DX: Aphasia: R47.01

## 2017-11-29 SURGERY — EXAM UNDER ANESTHESIA, EYE
Anesthesia: General | Laterality: Left

## 2017-11-29 MED ORDER — FENTANYL CITRATE (PF) 100 MCG/2ML IJ SOLN
INTRAMUSCULAR | Status: AC
  Filled 2017-11-29: qty 2

## 2017-11-29 MED ORDER — KETOROLAC TROMETHAMINE 30 MG/ML IJ SOLN
INTRAMUSCULAR | Status: AC
  Filled 2017-11-29: qty 1

## 2017-11-29 MED ORDER — BSS IO SOLN
INTRAOCULAR | Status: DC | PRN
  Administered 2017-11-29: 15 mL

## 2017-11-29 MED ORDER — MIDAZOLAM HCL 5 MG/5ML IJ SOLN
INTRAMUSCULAR | Status: DC | PRN
  Administered 2017-11-29: 2 mg via INTRAVENOUS

## 2017-11-29 MED ORDER — ONDANSETRON HCL 4 MG/2ML IJ SOLN
INTRAMUSCULAR | Status: AC
  Filled 2017-11-29: qty 2

## 2017-11-29 MED ORDER — LIDOCAINE 2% (20 MG/ML) 5 ML SYRINGE
INTRAMUSCULAR | Status: DC | PRN
  Administered 2017-11-29: 100 mg via INTRAVENOUS

## 2017-11-29 MED ORDER — FENTANYL CITRATE (PF) 100 MCG/2ML IJ SOLN
INTRAMUSCULAR | Status: DC | PRN
  Administered 2017-11-29: 50 ug via INTRAVENOUS

## 2017-11-29 MED ORDER — TOBRAMYCIN-DEXAMETHASONE 0.3-0.1 % OP OINT
TOPICAL_OINTMENT | OPHTHALMIC | Status: DC | PRN
  Administered 2017-11-29: 1 via OPHTHALMIC

## 2017-11-29 MED ORDER — PHENYLEPHRINE HCL 2.5 % OP SOLN
OPHTHALMIC | Status: DC | PRN
  Administered 2017-11-29: 3 [drp] via OPHTHALMIC

## 2017-11-29 MED ORDER — PROPOFOL 10 MG/ML IV BOLUS
INTRAVENOUS | Status: DC | PRN
  Administered 2017-11-29: 130 mg via INTRAVENOUS

## 2017-11-29 MED ORDER — MIDAZOLAM HCL 2 MG/2ML IJ SOLN
INTRAMUSCULAR | Status: AC
  Filled 2017-11-29: qty 2

## 2017-11-29 MED ORDER — LIDOCAINE 2% (20 MG/ML) 5 ML SYRINGE
INTRAMUSCULAR | Status: AC
  Filled 2017-11-29: qty 5

## 2017-11-29 MED ORDER — LACTATED RINGERS IV SOLN
INTRAVENOUS | Status: DC
  Administered 2017-11-29: 08:00:00 via INTRAVENOUS
  Filled 2017-11-29: qty 1000

## 2017-11-29 MED ORDER — DEXAMETHASONE SODIUM PHOSPHATE 10 MG/ML IJ SOLN
INTRAMUSCULAR | Status: AC
  Filled 2017-11-29: qty 1

## 2017-11-29 MED ORDER — PROPOFOL 10 MG/ML IV BOLUS
INTRAVENOUS | Status: AC
  Filled 2017-11-29: qty 20

## 2017-11-29 SURGICAL SUPPLY — 34 items
APL SRG 3 HI ABS STRL LF PLS (MISCELLANEOUS) ×1
APPLICATOR DR MATTHEWS STRL (MISCELLANEOUS) ×3 IMPLANT
BANDAGE EYE OVAL (MISCELLANEOUS) IMPLANT
BLADE SURG 15 STRL LF DISP TIS (BLADE) ×1 IMPLANT
BLADE SURG 15 STRL SS (BLADE) ×3
CAUTERY EYE LOW TEMP 1300F FIN (OPHTHALMIC RELATED) ×3 IMPLANT
CLOSURE WOUND 1/2 X4 (GAUZE/BANDAGES/DRESSINGS) ×1
CORDS BIPOLAR (ELECTRODE) IMPLANT
COVER BACK TABLE 60X90IN (DRAPES) ×3 IMPLANT
COVER MAYO STAND STRL (DRAPES) ×3 IMPLANT
COVER WAND RF STERILE (DRAPES) ×6 IMPLANT
DRAPE SHEET LG 3/4 BI-LAMINATE (DRAPES) ×3 IMPLANT
DRAPE SURG 17X23 STRL (DRAPES) ×9 IMPLANT
GLOVE SURG SIGNA 7.5 PF LTX (GLOVE) ×3 IMPLANT
GOWN STRL REUS W/ TWL LRG LVL3 (GOWN DISPOSABLE) ×1 IMPLANT
GOWN STRL REUS W/TWL LRG LVL3 (GOWN DISPOSABLE) ×3
KIT TURNOVER CYSTO (KITS) ×3 IMPLANT
MANIFOLD NEPTUNE II (INSTRUMENTS) IMPLANT
NDL HYPO 30X.5 LL (NEEDLE) ×1 IMPLANT
NEEDLE HYPO 30X.5 LL (NEEDLE) ×3 IMPLANT
NS IRRIG 500ML POUR BTL (IV SOLUTION) ×3 IMPLANT
PACK BASIN DAY SURGERY FS (CUSTOM PROCEDURE TRAY) ×3 IMPLANT
SPEAR EYE SURGICAL ST (MISCELLANEOUS) IMPLANT
STRIP CLOSURE SKIN 1/2X4 (GAUZE/BANDAGES/DRESSINGS) ×2 IMPLANT
SUT MERSILENE 6 0 S14 DA (SUTURE) IMPLANT
SUT VICRYL 6 0 S 29 12 (SUTURE) ×3 IMPLANT
SUT VICRYL 7 0 TG140 8 (SUTURE) IMPLANT
SUT VICRYL 8 0 TG140 8 (SUTURE) IMPLANT
SYR 3ML 23GX1 SAFETY (SYRINGE) ×3 IMPLANT
TOWEL OR 17X24 6PK STRL BLUE (TOWEL DISPOSABLE) ×3 IMPLANT
TRAY DSU PREP LF (CUSTOM PROCEDURE TRAY) ×3 IMPLANT
TUBE CONNECTING 12'X1/4 (SUCTIONS)
TUBE CONNECTING 12X1/4 (SUCTIONS) IMPLANT
WATER STERILE IRR 500ML POUR (IV SOLUTION) IMPLANT

## 2017-11-29 NOTE — Transfer of Care (Signed)
Immediate Anesthesia Transfer of Care Note  Patient: Joel StarrCharlie Senseney  Procedure(s) Performed: EYE EXAM UNDER ANESTHESIA WITH REMOVAL OF CORNEAL SUTURES OF LEFT EYE (Left )  Patient Location: PACU  Anesthesia Type:General  Level of Consciousness: sedated  Airway & Oxygen Therapy: Patient Spontanous Breathing and Patient connected to face mask oxygen  Post-op Assessment: Report given to RN  Post vital signs: Reviewed and stable  Last Vitals: 112/75. 76, 10, 100% Vitals Value Taken Time  BP    Temp    Pulse    Resp    SpO2      Last Pain: There were no vitals filed for this visit.       Complications: No apparent anesthesia complications

## 2017-11-29 NOTE — Op Note (Signed)
NAME: Joel StarrCHEN, Yaser MEDICAL RECORD WU:[id number] ACCOUNT [id number] DATE OF BIRTH:13-Apr-1990 FACILITY: [facility] LOCATION: [facility] PHYSICIAN:Lasean Rahming Scotty CourtA. Briawna Carver, MD  OPERATIVE REPORT  DATE OF PROCEDURE:  11/29/2017  PREOPERATIVE DIAGNOSIS:  Status post repair of ruptured globe, left eye.  PROCEDURE:  Examination under anesthesia with corneal sutures removal of the left eye.  POSTOPERATIVE DIAGNOSIS:  Status post repair of ruptured globe with removal of corneal sutures.  SURGEON:  Aura CampsMichael Destyn Schuyler, MD  ANESTHESIA:  General with laryngeal mask airway.  INDICATIONS:  The patient is a 27 year old cognitive delayed autistic, Asian male who is status post a traumatic rupture of the left eye, status post repair of ruptured globe with corneal sutures indwelling status post repair of corneal and scleral laceration.  This procedure was indicated to examine the anterior segment for potential visual recovery and also for removal of corneal sutures.  The risks and benefits of the procedure explained to the patient's guardian prior to procedure, informed consent was obtained.  DESCRIPTION OF TECHNIQUE:  The patient was taken to the operating room and placed in supine position.  The entire face was prepped and draped in the usual sterile fashion. My Attention was first directed to the left eye.  A lid speculum was placed and using the operating microscope, the anterior segment was examined.  It was noted that the corneal sutures were now loosened and it appeared that the patient had some interval secondary trauma to the eye such as pressure pressing on the eye with a spontaneously sealed leakage defect.Resulting in a  deformed cornea with corneal and conjunctival scarring in the region of the previous repair.  The anterior chamber to a certain extent superiorly for approximately to 70 degrees was formed.  It could be seen.  Iris details could be seen and there was clearly a well-formed mature cataract with a  corneal iris touch inferiorly and posterior synechiae.  The loose sutures were removed sequentially with Vannas scissors and Colibri forceps.  The wound was then checked to ensure continued closure and sealing.  Once this was  assured, the eye was irrigated and TobraDex ointment was then applied to the eye.  There were no apparent complications.  TN/NUANCE  D:11/29/2017 T:11/29/2017 JOB:[id number]

## 2017-11-29 NOTE — Anesthesia Postprocedure Evaluation (Signed)
Anesthesia Post Note  Patient: Terisa StarrCharlie Crossen  Procedure(s) Performed: EYE EXAM UNDER ANESTHESIA WITH REMOVAL OF CORNEAL SUTURES OF LEFT EYE (Left )     Patient location during evaluation: PACU Anesthesia Type: General Level of consciousness: awake and alert Pain management: pain level controlled Vital Signs Assessment: post-procedure vital signs reviewed and stable Respiratory status: spontaneous breathing, nonlabored ventilation, respiratory function stable and patient connected to nasal cannula oxygen Cardiovascular status: blood pressure returned to baseline and stable Postop Assessment: no apparent nausea or vomiting Anesthetic complications: no    Last Vitals:  Vitals:   11/29/17 1140 11/29/17 1145  BP: 125/85   Pulse: 96 89  Resp: 20 (!) 24  Temp:    SpO2: 100% 100%    Last Pain: There were no vitals filed for this visit.               Milagro Belmares L Briseis Aguilera

## 2017-11-29 NOTE — Discharge Instructions (Signed)
D/C IVF when VSS :   Advance to PO as tolerated.  Call your surgeon if you experience:   1.  Fever over 101.0. 2.  Nausea and/or vomiting. 3.  Extreme swelling or bruising at the surgical site. 4.  Continued bleeding from the incision. 5.  Increased pain, redness or drainage. 6.  Any problems and/or concerns  Post Anesthesia Home Care Instructions  Activity: Get plenty of rest for the remainder of the day. A responsible individual must stay with you for 24 hours following the procedure.  For the next 24 hours, DO NOT: -Drive a car -Advertising copywriterperate machinery -Drink alcoholic beverages -Take any medication unless instructed by your physician -Make any legal decisions or sign important papers.  Meals: Start with liquid foods such as gelatin or soup. Progress to regular foods as tolerated. Avoid greasy, spicy, heavy foods. If nausea and/or vomiting occur, drink only clear liquids until the nausea and/or vomiting subsides. Call your physician if vomiting continues.  Special Instructions/Symptoms: Your throat may feel dry or sore from the anesthesia or the breathing tube placed in your throat during surgery. If this causes discomfort, gargle with warm salt water. The discomfort should disappear within 24 hours.  If you had a scopolamine patch placed behind your ear for the management of post- operative nausea and/or vomiting:  1. The medication in the patch is effective for 72 hours, after which it should be removed.  Wrap patch in a tissue and discard in the trash. Wash hands thoroughly with soap and water. 2. You may remove the patch earlier than 72 hours if you experience unpleasant side effects which may include dry mouth, dizziness or visual disturbances. 3. Avoid touching the patch. Wash your hands with soap and water after contact with the patch.

## 2017-11-29 NOTE — Anesthesia Preprocedure Evaluation (Addendum)
Anesthesia Evaluation  Patient identified by MRN, date of birth, ID band Patient awake    Reviewed: Allergy & Precautions, NPO status , Patient's Chart, lab work & pertinent test results  Airway   TM Distance: >3 FB Neck ROM: Full   Comment: Unable to assess MP, pt with MR and unable to follow directions Dental  (+) Teeth Intact, Dental Advisory Given   Pulmonary neg pulmonary ROS,    breath sounds clear to auscultation       Cardiovascular negative cardio ROS   Rhythm:Regular Rate:Normal     Neuro/Psych PSYCHIATRIC DISORDERS Anxiety negative neurological ROS     GI/Hepatic Neg liver ROS, GERD  ,  Endo/Other  negative endocrine ROS  Renal/GU negative Renal ROS  negative genitourinary   Musculoskeletal negative musculoskeletal ROS (+)   Abdominal   Peds  (+) ADHDAutism, mild MR,    Hematology negative hematology ROS (+)   Anesthesia Other Findings Ruptured globe left eye  Reproductive/Obstetrics                            Anesthesia Physical Anesthesia Plan  ASA: III  Anesthesia Plan: General   Post-op Pain Management:    Induction: Intravenous  PONV Risk Score and Plan: 2 and Ondansetron, Dexamethasone, Midazolam and Treatment may vary due to age or medical condition  Airway Management Planned: Oral ETT and LMA  Additional Equipment:   Intra-op Plan:   Post-operative Plan: Extubation in OR  Informed Consent: I have reviewed the patients History and Physical, chart, labs and discussed the procedure including the risks, benefits and alternatives for the proposed anesthesia with the patient or authorized representative who has indicated his/her understanding and acceptance.   Dental advisory given  Plan Discussed with: CRNA  Anesthesia Plan Comments:        Anesthesia Quick Evaluation

## 2017-11-29 NOTE — Brief Op Note (Signed)
11/29/2017  11:23 AM  PATIENT:  Terisa Starrharlie Yeh  27 y.o. male  PRE-OPERATIVE DIAGNOSIS:  RUPTURED GLOBE LEFT EYE  POST-OPERATIVE DIAGNOSIS:  * No post-op diagnosis entered *  PROCEDURE:  Procedure(s): EYE EXAM UNDER ANESTHESIA WITH REMOVAL OF CORNEAL SUTURES OF LEFT EYE (Left)  SURGEON:  Surgeon(s) and Role:    Aura Camps* Elianne Gubser, MD - Primary  PHYSICIAN ASSISTANT:   ASSISTANTS: none   ANESTHESIA:   general  EBL:  0 mL   BLOOD ADMINISTERED:none  DRAINS: none   LOCAL MEDICATIONS USED:  NONE  SPECIMEN:  No Specimen  DISPOSITION OF SPECIMEN:  N/A  COUNTS:  YES  TOURNIQUET:  * No tourniquets in log *  DICTATION: .Other Dictation: Dictation Number [id number]  PLAN OF CARE: Discharge to home after PACU  PATIENT DISPOSITION:  PACU - hemodynamically stable.   Delay start of Pharmacological VTE agent (>24hrs) due to surgical blood loss or risk of bleeding: yes

## 2017-11-29 NOTE — Anesthesia Procedure Notes (Signed)
Procedure Name: LMA Insertion Date/Time: 11/29/2017 10:41 AM Performed by: Briant Sitesenenny, Bryttney Netzer T, CRNA Pre-anesthesia Checklist: Patient identified, Emergency Drugs available, Suction available and Patient being monitored Patient Re-evaluated:Patient Re-evaluated prior to induction Oxygen Delivery Method: Circle system utilized Preoxygenation: Pre-oxygenation with 100% oxygen Induction Type: IV induction Ventilation: Mask ventilation without difficulty LMA: LMA flexible inserted LMA Size: 4.0 Number of attempts: 1 Airway Equipment and Method: Bite block Placement Confirmation: positive ETCO2 Tube secured with: Tape Dental Injury: Teeth and Oropharynx as per pre-operative assessment

## 2017-12-04 ENCOUNTER — Encounter (HOSPITAL_BASED_OUTPATIENT_CLINIC_OR_DEPARTMENT_OTHER): Payer: Self-pay | Admitting: Ophthalmology

## 2017-12-06 ENCOUNTER — Ambulatory Visit: Payer: Medicaid Other | Admitting: Podiatry

## 2021-05-14 ENCOUNTER — Emergency Department (HOSPITAL_COMMUNITY): Payer: Medicaid Other

## 2021-05-14 ENCOUNTER — Encounter (HOSPITAL_COMMUNITY): Payer: Self-pay

## 2021-05-14 ENCOUNTER — Other Ambulatory Visit: Payer: Self-pay

## 2021-05-14 ENCOUNTER — Encounter (HOSPITAL_COMMUNITY): Payer: Self-pay | Admitting: Emergency Medicine

## 2021-05-14 ENCOUNTER — Emergency Department (HOSPITAL_COMMUNITY)
Admission: EM | Admit: 2021-05-14 | Discharge: 2021-05-14 | Disposition: A | Discharge status: Home / Self Care | Payer: Medicaid Other | Source: Home / Self Care | Attending: Emergency Medicine | Admitting: Emergency Medicine

## 2021-05-14 ENCOUNTER — Observation Stay (HOSPITAL_COMMUNITY)
Admission: EM | Admit: 2021-05-14 | Discharge: 2021-05-15 | Disposition: A | Discharge status: Home / Self Care | Payer: Medicaid Other | Attending: Internal Medicine | Admitting: Internal Medicine

## 2021-05-14 DIAGNOSIS — F39 Unspecified mood [affective] disorder: Secondary | ICD-10-CM | POA: Diagnosis not present

## 2021-05-14 DIAGNOSIS — F840 Autistic disorder: Secondary | ICD-10-CM | POA: Insufficient documentation

## 2021-05-14 DIAGNOSIS — R569 Unspecified convulsions: Secondary | ICD-10-CM | POA: Insufficient documentation

## 2021-05-14 LAB — CBC WITH DIFFERENTIAL/PLATELET
Abs Immature Granulocytes: 0.01 10*3/uL (ref 0.00–0.07)
Basophils Absolute: 0 10*3/uL (ref 0.0–0.1)
Basophils Relative: 0 %
Eosinophils Absolute: 0 10*3/uL (ref 0.0–0.5)
Eosinophils Relative: 0 %
HCT: 47.5 % (ref 39.0–52.0)
Hemoglobin: 16 g/dL (ref 13.0–17.0)
Immature Granulocytes: 0 %
Lymphocytes Relative: 20 %
Lymphs Abs: 1.1 10*3/uL (ref 0.7–4.0)
MCH: 30.7 pg (ref 26.0–34.0)
MCHC: 33.7 g/dL (ref 30.0–36.0)
MCV: 91.2 fL (ref 80.0–100.0)
Monocytes Absolute: 0.3 10*3/uL (ref 0.1–1.0)
Monocytes Relative: 6 %
Neutro Abs: 4.2 10*3/uL (ref 1.7–7.7)
Neutrophils Relative %: 74 %
Platelets: 255 10*3/uL (ref 150–400)
RBC: 5.21 MIL/uL (ref 4.22–5.81)
RDW: 11.9 % (ref 11.5–15.5)
WBC: 5.7 10*3/uL (ref 4.0–10.5)
nRBC: 0 % (ref 0.0–0.2)

## 2021-05-14 LAB — COMPREHENSIVE METABOLIC PANEL
ALT: 10 U/L (ref 0–44)
AST: 32 U/L (ref 15–41)
Albumin: 4.7 g/dL (ref 3.5–5.0)
Alkaline Phosphatase: 128 U/L — ABNORMAL HIGH (ref 38–126)
Anion gap: 9 (ref 5–15)
BUN: 8 mg/dL (ref 6–20)
CO2: 27 mmol/L (ref 22–32)
Calcium: 9.3 mg/dL (ref 8.9–10.3)
Chloride: 103 mmol/L (ref 98–111)
Creatinine, Ser: 0.88 mg/dL (ref 0.61–1.24)
GFR, Estimated: >60 mL/min (ref >60)
Glucose, Bld: 108 mg/dL — ABNORMAL HIGH (ref 70–99)
Potassium: 4.8 mmol/L (ref 3.5–5.1)
Sodium: 139 mmol/L (ref 135–145)
Total Bilirubin: 0.7 mg/dL (ref 0.3–1.2)
Total Protein: 7.8 g/dL (ref 6.5–8.1)

## 2021-05-14 LAB — MAGNESIUM: Magnesium: 2.4 mg/dL (ref 1.7–2.4)

## 2021-05-14 MED ORDER — ACETAMINOPHEN 650 MG RE SUPP: 650.0000 mg | Freq: Four times a day (QID) | RECTAL | Status: DC | PRN

## 2021-05-14 MED ORDER — POLYETHYLENE GLYCOL 3350 17 G PO PACK: 17.0000 g | PACK | Freq: Every day | ORAL | Status: DC

## 2021-05-14 MED ORDER — ASENAPINE MALEATE 10 MG SL SUBL: 10.0000 mg | SUBLINGUAL_TABLET | SUBLINGUAL | Status: DC

## 2021-05-14 MED ORDER — PANTOPRAZOLE SODIUM 40 MG PO TBEC: 40.0000 mg | DELAYED_RELEASE_TABLET | Freq: Two times a day (BID) | ORAL | Status: DC

## 2021-05-14 MED ORDER — ENSURE PO LIQD: 237.0000 mL | ORAL | Status: DC

## 2021-05-14 MED ORDER — ENOXAPARIN SODIUM 40 MG/0.4ML IJ SOSY: 40.0000 mg | PREFILLED_SYRINGE | INTRAMUSCULAR | Status: DC

## 2021-05-14 MED ORDER — ACETAMINOPHEN 325 MG PO TABS: 650.0000 mg | ORAL_TABLET | Freq: Four times a day (QID) | ORAL | Status: DC | PRN

## 2021-05-14 MED ORDER — LEVETIRACETAM IN NACL 1500 MG/100ML IV SOLN
1500.0000 mg | Freq: Once | INTRAVENOUS | Status: AC
  Administered 2021-05-14: 1500 mg via INTRAVENOUS
  Filled 2021-05-14: qty 100

## 2021-05-14 MED ORDER — GUANFACINE HCL 1 MG PO TABS
2.0000 mg | ORAL_TABLET | ORAL | Status: DC
  Filled 2021-05-14: qty 2

## 2021-05-14 MED ORDER — CARBAMAZEPINE ER 200 MG PO CP12: 300.0000 mg | ORAL_CAPSULE | Freq: Two times a day (BID) | ORAL | Status: DC

## 2021-05-14 MED ORDER — SODIUM CHLORIDE 0.9 % IV BOLUS
1000.0000 mL | Freq: Once | INTRAVENOUS | Status: AC
  Administered 2021-05-14: 1000 mL via INTRAVENOUS

## 2021-05-14 MED ORDER — ONDANSETRON HCL 4 MG PO TABS: 4.0000 mg | ORAL_TABLET | Freq: Four times a day (QID) | ORAL | Status: DC | PRN

## 2021-05-14 MED ORDER — LORAZEPAM 1 MG PO TABS: 1.0000 mg | ORAL_TABLET | Freq: Two times a day (BID) | ORAL | Status: DC

## 2021-05-14 MED ORDER — SODIUM CHLORIDE 0.9 % IV SOLN: INTRAVENOUS | Status: DC

## 2021-05-14 MED ORDER — BENZONATATE 100 MG PO CAPS: 100.0000 mg | ORAL_CAPSULE | Freq: Two times a day (BID) | ORAL | Status: DC

## 2021-05-14 MED ORDER — SODIUM CHLORIDE 0.9% FLUSH
3.0000 mL | Freq: Two times a day (BID) | INTRAVENOUS | Status: DC
  Administered 2021-05-15: 3 mL via INTRAVENOUS

## 2021-05-14 MED ORDER — CHLORPROMAZINE HCL 25 MG PO TABS: 100.0000 mg | ORAL_TABLET | Freq: Two times a day (BID) | ORAL | Status: DC

## 2021-05-14 MED ORDER — ONDANSETRON HCL 4 MG/2ML IJ SOLN
4.0000 mg | Freq: Four times a day (QID) | INTRAMUSCULAR | Status: DC | PRN
Start: 2021-05-14 — End: 2021-05-15

## 2021-05-14 NOTE — ED Triage Notes (Signed)
Pt BIB EMS due to seizures. Pt was here earlier for the same reason. Pt has autism. Family states it lasted 5 min.  ?

## 2021-05-14 NOTE — ED Provider Notes (Signed)
?[facility] EMERGENCY DEPARTMENT ?Provider Note ? ? ?CSN: [id number] ?Arrival date & time: 05/14/21  1854 ? ?  ? ?History ? ?Chief Complaint  ?Patient presents with  ? Seizures  ? ? ?[name] is a 31 y.o. male. ? ?HPI ?Patient returns to the ED, by EMS, shortly following discharge.  He was seen earlier and discharged, after a spell at daycare.  His AFL caregiver was driving him back to their home, when he suddenly lost consciousness and began shaking all over.  This lasted for 3 minutes then was followed by drooling from the mouth.  At that point he was sleepy and she laid him on the ground.  He stayed there until EMS arrived and began to awaken somewhat.  Following EMS transport, in the room, he has recovered his baseline according to his AFL caregiver.  Patient does not have a history of seizures.  He has been at his current AFL for 18 years.  He takes his medications regularly.  He has not been ill recently.  He had a comprehensive evaluation earlier.  There were no acute concerning abnormalities on blood work or CT imaging of the head. ?  ? ?Home Medications ?Prior to Admission medications   ?Medication Sig Start Date End Date Taking? Authorizing Provider  ?Asenapine Maleate (SAPHRIS) 10 MG SUBL Place 10 mg under the tongue every morning.     [provider]  ?benzonatate (TESSALON) 100 MG capsule Take 100 mg by mouth 2 (two) times daily.    [provider]  ?cetirizine (ZYRTEC) 10 MG tablet Take 10 mg by mouth every morning.     [provider]  ?ciclopirox (PENLAC) 8 % solution Apply 1 application topically at bedtime. Apply to both big toes daily at bedtime     [provider]  ?clindamycin-benzoyl peroxide (BENZACLIN) gel Apply 1 application topically 2 (two) times daily.     [provider]  ?ENSURE (ENSURE) Take 237 mLs by mouth 2 (two) times daily with breakfast and lunch.    [provider]  ?fluticasone (FLONASE) 50 MCG/ACT nasal  spray Place 1 spray into both nostrils at bedtime.  10/03/16   [provider]  ?gabapentin (NEURONTIN) 300 MG capsule Take 300 mg by mouth 2 (two) times daily.    [provider]  ?guanFACINE (TENEX) 2 MG tablet Take 1 tablet (2 mg total) by mouth 2 (two) times daily. 01/30/17   Laveda Abbe, NP  ?hydrOXYzine (ATARAX/VISTARIL) 50 MG tablet Take 50 mg by mouth 3 (three) times daily.    [provider]  ?LORazepam (ATIVAN) 1 MG tablet Take 1 tablet (1 mg total) by mouth 3 (three) times daily. 01/27/17   Charm Rings, NP  ?megestrol (MEGACE) 40 MG/ML suspension Take 200 mg by mouth 2 (two) times daily.    [provider]  ?moxifloxacin (VIGAMOX) 0.5 % ophthalmic solution Place 1 drop into the left eye every morning.    [provider]  ?OLANZapine (ZYPREXA) 10 MG tablet Take 10 mg by mouth 2 (two) times daily.    [provider]  ?omeprazole (PRILOSEC) 20 MG capsule Take 20 mg by mouth 2 (two) times daily.     [provider]  ?polyethylene glycol powder (GLYCOLAX/MIRALAX) powder Take 17 g by mouth daily. Mix in 8 oz milk and drink 12/14/16   [provider]  ?prednisoLONE acetate (PRED FORTE) 1 % ophthalmic suspension Place 1 drop into the left eye every morning.  [provider]  ?tobramycin-dexamethasone Wallene Dales) ophthalmic ointment Place 1 application into the left eye at bedtime.    [provider]  ?   ? ?Allergies    ?Patient has no known allergies.   ? ?Review of Systems   ?Review of Systems ? ?Physical Exam ?Updated Vital Signs ?BP 111/71   Pulse (!) 108   Temp 97.7 ?F (36.5 ?C) (Oral)   Resp 12   Ht 5\' 11"  (1.803 m)   Wt 65.8 kg   SpO2 98%   BMI 20.22 kg/m?  ?Physical Exam ?Vitals and nursing note reviewed.  ?Constitutional:   ?   General: He is not in acute distress. ?   Appearance: He is well-developed. He is not ill-appearing or diaphoretic.  ?HENT:  ?   Head: Normocephalic and atraumatic.  ?    Right Ear: External ear normal.  ?   Left Ear: External ear normal.  ?   Nose: No congestion or rhinorrhea.  ?   Mouth/Throat:  ?   Mouth: Mucous membranes are moist.  ?   Comments: No evident injury to lips, tongue or teeth.  Exam limited by autism. ?Eyes:  ?   Conjunctiva/sclera: Conjunctivae normal.  ?   Pupils: Pupils are equal, round, and reactive to light.  ?Neck:  ?   Trachea: Phonation normal.  ?Cardiovascular:  ?   Rate and Rhythm: Normal rate and regular rhythm.  ?   Heart sounds: Normal heart sounds.  ?Pulmonary:  ?   Effort: Pulmonary effort is normal.  ?   Breath sounds: Normal breath sounds.  ?Abdominal:  ?   Palpations: Abdomen is soft.  ?   Tenderness: There is no abdominal tenderness.  ?Musculoskeletal:     ?   General: No swelling, tenderness or deformity. Normal range of motion.  ?   Cervical back: Normal range of motion and neck supple.  ?Skin: ?   General: Skin is warm and dry.  ?Neurological:  ?   Mental Status: He is alert.  ?   Cranial Nerves: No cranial nerve deficit.  ?   Sensory: No sensory deficit.  ?   Motor: No abnormal muscle tone.  ?   Coordination: Coordination normal.  ?   Comments: He is nonverbal at baseline  ?Psychiatric:     ?   Behavior: Behavior normal.  ? ? ?ED Results / Procedures / Treatments   ?Labs ?(all labs ordered are listed, but only abnormal results are displayed) ?Labs Reviewed - No data to display ? ?EKG ?None ? ?Radiology ?CT HEAD WO CONTRAST ? ?Result Date: 05/14/2021 ?CLINICAL DATA:  Seizure EXAM: CT HEAD WITHOUT CONTRAST TECHNIQUE: Contiguous axial images were obtained from the base of the skull through the vertex without intravenous contrast. RADIATION DOSE REDUCTION: This exam was performed according to the departmental dose-optimization program which includes automated exposure control, adjustment of the mA and/or kV according to patient size and/or use of iterative reconstruction technique. COMPARISON:  None available. FINDINGS: Brain: There is no acute  intracranial hemorrhage, extra-axial fluid collection, or acute infarct. Parenchymal volume is normal. The ventricles are normal in size. Gray-white differentiation is preserved. There is no mass lesion.  There is no mass effect or midline shift. Vascular: No hyperdense vessel or unexpected calcification. Skull: Normal. Negative for fracture or focal lesion. Sinuses/Orbits: Imaged paranasal sinuses are clear. The left globe is smaller than the right presumed chronic. Other: None. IMPRESSION: 1. No acute intracranial pathology. 2. 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 Electronically Signed   By: Lesia HausenPeter  Noone M.D.   On: 05/14/2021 15:49   ? ?Procedures ?Procedures  ? ? ?Medications Ordered in ED ?Medications  ?levETIRAcetam (KEPPRA) IVPB 1500 mg/ 100 mL premix (0 mg Intravenous Stopped 05/14/21 2022)  ? ? ?ED Course/ Medical Decision Making/ A&P ?Clinical Course as of 05/14/21 2156  ?Fri May 14, 2021  ?2032 He remains calm comfortable without shaking or seizure.  Keppra infusion almost complete.  Heart rate improved. [EW]  ?  ?Clinical Course User Index ?[EW] Mancel BaleWentz, Miken Stecher, MD  ? ?                        ?Medical Decision Making ?Patient returns the ED by EMS for described seizure.  This is the second spell he had today, therefore is convincing for new onset seizure disorder.  He has history of autism.  No known recent head trauma.  Prior observation evaluation in the ED, earlier today, was reassuring so he was discharged. ? ?Amount and/or Complexity of Data Reviewed ?Independent Historian: caregiver ?   Details: His AFL care provider.  She states that the patient was recently changed from carbamazepine to gabapentin for treatment of behavioral disorder.  He has been on the gabapentin for 5 days. ?Labs:  ?   Details: Labs from earlier today, CBC, metabolic panel, magnesium, urinalysis-no acute abnormalities other than mild elevation alkaline  phosphatase. ?Radiology: independent interpretation performed. ?   Details: CT scan from earlier today, 3:50 PM,, no acute intracranial abnormalities. ?Discussion of management or test interpretation with external provider(s):

## 2021-05-14 NOTE — ED Triage Notes (Addendum)
Pt's AFF provider reported they just left from the ED about 15 mins ago and they were driving and pt started having seizure like activity. Pt's AFF provider reports his body went stiff, then whole body shaking, head turning colors and foaming at mouth. EMS reported pt minimally responsive at first and then once arrived to the ED he became agitated. Pt's AFF provider reports pt is now at baseline. ?

## 2021-05-14 NOTE — Assessment & Plan Note (Signed)
On multiple medications for mood disorder with anxiety/agitation/behavioral disturbance, follows with neuropsychiatry. ?-Restarted on carbamazepine 300 mg BID ?-Continue home Ativan 1 mg BID, asenapine 10 mg BID, chlorpromazine 100 mg BID, guanfacine 2 mg BID ?

## 2021-05-14 NOTE — Hospital Course (Signed)
[name] is a 31 y.o. male with medical history significant for intellectual disability, ADHD, autism spectrum disorder, anxiety with agitation/aggressive behavior, GERD who is admitted with seizure-like activity. ?

## 2021-05-14 NOTE — H&P (Signed)
?History and Physical  ? ? ?[name] XNA:[id number] DOB: 1990-04-12 DOA: 05/14/2021 ? ?PCP: Fleet Contras, MD  ?Patient coming from: Home ? ?I have personally briefly reviewed patient's old medical records in [facility] ? ?Chief Complaint: Seizure-like activity ? ?HPI: ?[name] is a 31 y.o. male with medical history significant for intellectual disability, ADHD, autism spectrum disorder, anxiety with agitation/aggressive behavior, GERD who presented to the ED for evaluation of seizure-like activity.  History is obtained from chart review, EDP, and patient's caregiver at bedside. ? ?Patient was initially seen at his daycare with unusual movement described as twitching of his eyes and head shaking movement.  He was brought to the ED at which time he appeared to be back to his baseline status.  CT head was negative. ? ?His caregiver was driving him home when he suddenly began to have seizure-like activity.  His whole body initially went stiff then began shaking throughout.  His eyes were rolling back and he was having frothy sputum from his mouth.  EMS were called and initially he was minimally responsive on their arrival but subsequently became agitated.  He was brought back to the ED for further evaluation. ? ?Of note, patient's neuropsychiatrist recently changed his medications due to aggressive behavior at his daycare center.  Patient was taken off of carbamazepine 300 mg BID on 05/10/21 and started on gabapentin 400 mg TID. ? ?ED Course  Labs/Imaging on admission: I have personally reviewed following labs and imaging studies. ? ?Initial vitals showed BP 120/79, pulse 110, RR 12, temp 97.7 ?F, SPO2 100% on room air. ? ?Labs showed sodium 139, potassium 4.8, bicarb 27, BUN 8, creatinine 0.88, serum glucose 108, WBC 5.7, hemoglobin 16.0, platelets 255,000, magnesium 2.4. ? ?CT head without contrast negative for acute intracranial pathology.  Asymmetric small size of the left globe noted, felt related to  prior ruptured globe. ? ?Patient was given IV Keppra 1500 mg.  EDP discussed with on-call neurology who felt cluster seizure-like activities within 24 hours are considered 1 seizure.  Recommendation was for medical admission for observation without additional antiepileptics at this time and to obtain EEG.  The hospitalist service was consulted to admit for further evaluation and management. ? ?Review of Systems: All systems reviewed and are negative except as documented in history of present illness above. ? ? ?Past Medical History:  ?Diagnosis Date  ? ADHD (attention deficit hyperactivity disorder)   ? Anxiety   ? Autism   ? GERD (gastroesophageal reflux disease)   ? Gilles de la Tourette disorder   ? History of  ? Mental retardation   ? Moderate intellectual disabilities   ? Nail fungus   ? Nonverbal   ? Pneumonia 12/2012  ? Right lower lobe  ? ? ?Past Surgical History:  ?Procedure Laterality Date  ? EYE EXAMINATION UNDER ANESTHESIA Left 11/29/2017  ? Procedure: EYE EXAM UNDER ANESTHESIA WITH REMOVAL OF CORNEAL SUTURES OF LEFT EYE;  Surgeon: Aura Camps, MD;  Location: [facility];  Service: Ophthalmology;  Laterality: Left;  ? RUPTURED GLOBE EXPLORATION AND REPAIR Left 08/25/2017  ? Procedure: REPAIR OF RUPTURED GLOBE;  Surgeon: Aura Camps, MD;  Location: [facility] OR;  Service: Ophthalmology;  Laterality: Left;  ? ? ?Social History: ? reports that he has never smoked. He has never used smokeless tobacco. He reports that he does not drink alcohol and does not use drugs. ? ?No Known Allergies ? ?History reviewed. No pertinent family history. ? ? ?Prior to Admission  medications   ?Medication Sig Start Date End Date Taking? Authorizing Provider  ?Asenapine Maleate (SAPHRIS) 10 MG SUBL Place 10 mg under the tongue every morning.     [provider]  ?benzonatate (TESSALON) 100 MG capsule Take 100 mg by mouth 2 (two) times daily.    [provider]  ?cetirizine (ZYRTEC) 10 MG tablet  Take 10 mg by mouth every morning.     [provider]  ?ciclopirox (PENLAC) 8 % solution Apply 1 application topically at bedtime. Apply to both big toes daily at bedtime     [provider]  ?clindamycin-benzoyl peroxide (BENZACLIN) gel Apply 1 application topically 2 (two) times daily.     [provider]  ?ENSURE (ENSURE) Take 237 mLs by mouth 2 (two) times daily with breakfast and lunch.    [provider]  ?fluticasone (FLONASE) 50 MCG/ACT nasal spray Place 1 spray into both nostrils at bedtime.  10/03/16   [provider]  ?gabapentin (NEURONTIN) 300 MG capsule Take 300 mg by mouth 2 (two) times daily.    [provider]  ?guanFACINE (TENEX) 2 MG tablet Take 1 tablet (2 mg total) by mouth 2 (two) times daily. 01/30/17   Laveda Abbe, NP  ?hydrOXYzine (ATARAX/VISTARIL) 50 MG tablet Take 50 mg by mouth 3 (three) times daily.    [provider]  ?LORazepam (ATIVAN) 1 MG tablet Take 1 tablet (1 mg total) by mouth 3 (three) times daily. 01/27/17   Charm Rings, NP  ?megestrol (MEGACE) 40 MG/ML suspension Take 200 mg by mouth 2 (two) times daily.    [provider]  ?moxifloxacin (VIGAMOX) 0.5 % ophthalmic solution Place 1 drop into the left eye every morning.    [provider]  ?OLANZapine (ZYPREXA) 10 MG tablet Take 10 mg by mouth 2 (two) times daily.    [provider]  ?omeprazole (PRILOSEC) 20 MG capsule Take 20 mg by mouth 2 (two) times daily.     [provider]  ?polyethylene glycol powder (GLYCOLAX/MIRALAX) powder Take 17 g by mouth daily. Mix in 8 oz milk and drink 12/14/16   [provider]  ?prednisoLONE acetate (PRED FORTE) 1 % ophthalmic suspension Place 1 drop into the left eye every morning.    [provider]  ?tobramycin-dexamethasone Wallene Dales) ophthalmic ointment Place 1 application into the left eye at bedtime.    [provider]  ? ? ?Physical Exam: ?Vitals:   ? 05/14/21 1905 05/14/21 1908 05/14/21 1945 05/14/21 2030  ?BP:   120/79 111/71  ?Pulse:   (!) 105 (!) 108  ?Resp:    12  ?Temp:      ?TempSrc:      ?SpO2:  100% 100% 98%  ?Weight: 65.8 kg     ?Height: 5\' 11"  (1.803 m)     ? ?Constitutional: Resting in bed, asleep.  NAD, calm, comfortable ?Eyes: lids and conjunctivae normal ?ENMT: Mucous membranes are moist. Posterior pharynx clear of any exudate or lesions. ?Neck: normal, supple, no masses. ?Respiratory: clear to auscultation anteriorly.  Normal respiratory effort. No accessory muscle use.  ?Cardiovascular: Regular rate and rhythm, no murmurs / rubs / gallops. No extremity edema. 2+ pedal pulses. ?Abdomen: no tenderness, no masses palpated.  ?Musculoskeletal: no clubbing / cyanosis. No joint deformity upper and lower extremities. Good ROM, no contractures. Normal muscle tone.  ?Skin: no rashes, lesions, ulcers. No induration ?Neurologic: Sensation intact, strength equal bilaterally. ?Psychiatric: Somnolent. ? ?EKG: Personally reviewed. Sinus tachycardia, rate 103,  RVH, early repolarization changes.  Rate is faster when compared to prior. ? ?Assessment/Plan ?Principal Problem: ?  Seizure-like activity (HCC) ?Active Problems: ?  Mood disorder (HCC) ?  ?[name] is a 31 y.o. male with medical history significant for intellectual disability, ADHD, autism spectrum disorder, anxiety with agitation/aggressive behavior, GERD who is admitted with seizure-like activity. ? ?Assessment and Plan: ?* Seizure-like activity (HCC) ?Patient with first-time seizure-like activity after recent discontinuation of carbamazepine and switch to gabapentin.  Neurology recommended starting back on carbamazepine otherwise no other additional antiepileptics at this time unless recurrent seizure activity occurs. ?-Start back on carbamazepine 300 mg twice daily, hold gabapentin ?-Routine EEG ?-MRI brain -obtain if able per neurology ?-Seizure precautions ?-Neurology available as  needed ? ?Mood disorder (HCC) ?On multiple medications for mood disorder with anxiety/agitation/behavioral disturbance, follows with neuropsychiatry. ?-Restarted on carbamazepine 300 mg BID ?-Continue home Ativan

## 2021-05-14 NOTE — ED Triage Notes (Signed)
Patient BIB [facility] from home for evaluation of seizure, no history of seizures. Patient's foster mother states patient was seated in a bean bag chair when he his head and neck started to move erratically. Patient is alert and in no apparent distress at this time. ?

## 2021-05-14 NOTE — ED Provider Notes (Signed)
The patient is found to be by Dr. Particia Nearing pending results of head CT and laboratory studies.  Those results were reviewed and showed no acute findings.  According to patient's caregiver, he is at his baseline.  Will discharge to her custody ?  ?Lorre Nick, MD ?05/14/21 1630 ? ?

## 2021-05-14 NOTE — ED Provider Notes (Signed)
?[facility] EMERGENCY DEPARTMENT ?Provider Note ? ? ?CSN: [id number] ?Arrival date & time: 05/14/21  1430 ? ?  ? ?History ? ?Chief Complaint  ?Patient presents with  ? Seizures  ? ? ?[name] is a 31 y.o. male. ? ?Pt is a 31 yo male with a pmhx significant for autism, intellectual disability, GERD, and ADHD.  Pt's caregiver and legal guardian gives the hx as pt is nonverbal.  Pt's caregiver [place]  Pt was at his day care center today when he developed twitching of his eyes and shaking of his head.  It is unclear how long this lasted, but he was back to normal when the caregiver got to the facility.  Pt has been fine this week.  He does not have a hx of seizures.  Per caregiver, pt is back to his normal MS. ? ? ?  ? ?Home Medications ?Prior to Admission medications   ?Medication Sig Start Date End Date Taking? Authorizing Provider  ?Asenapine Maleate (SAPHRIS) 10 MG SUBL Place 10 mg under the tongue every morning.     [provider]  ?benzonatate (TESSALON) 100 MG capsule Take 100 mg by mouth 2 (two) times daily.    [provider]  ?cetirizine (ZYRTEC) 10 MG tablet Take 10 mg by mouth every morning.     [provider]  ?ciclopirox (PENLAC) 8 % solution Apply 1 application topically at bedtime. Apply to both big toes daily at bedtime     [provider]  ?clindamycin-benzoyl peroxide (BENZACLIN) gel Apply 1 application topically 2 (two) times daily.     [provider]  ?ENSURE (ENSURE) Take 237 mLs by mouth 2 (two) times daily with breakfast and lunch.    [provider]  ?fluticasone (FLONASE) 50 MCG/ACT nasal spray Place 1 spray into both nostrils at bedtime.  10/03/16   [provider]  ?gabapentin (NEURONTIN) 300 MG capsule Take 300 mg by mouth 2 (two) times daily.    [provider]  ?guanFACINE (TENEX) 2 MG tablet Take 1 tablet (2 mg total) by mouth 2 (two) times daily. 01/30/17    Laveda Abbe, NP  ?hydrOXYzine (ATARAX/VISTARIL) 50 MG tablet Take 50 mg by mouth 3 (three) times daily.    [provider]  ?LORazepam (ATIVAN) 1 MG tablet Take 1 tablet (1 mg total) by mouth 3 (three) times daily. 01/27/17   Charm Rings, NP  ?megestrol (MEGACE) 40 MG/ML suspension Take 200 mg by mouth 2 (two) times daily.    [provider]  ?moxifloxacin (VIGAMOX) 0.5 % ophthalmic solution Place 1 drop into the left eye every morning.    [provider]  ?OLANZapine (ZYPREXA) 10 MG tablet Take 10 mg by mouth 2 (two) times daily.    [provider]  ?omeprazole (PRILOSEC) 20 MG capsule Take 20 mg by mouth 2 (two) times daily.     [provider]  ?polyethylene glycol powder (GLYCOLAX/MIRALAX) powder Take 17 g by mouth daily. Mix in 8 oz milk and drink 12/14/16   [provider]  ?prednisoLONE acetate (PRED FORTE) 1 % ophthalmic suspension Place 1 drop into the left eye every morning.    [provider]  ?tobramycin-dexamethasone Wallene Dales) ophthalmic ointment Place 1 application into the left eye at bedtime.    [provider]  ?   ? ?Allergies    ?Patient has no known allergies.   ? ?Review of Systems   ?Review  of Systems  ?Unable to perform ROS: Patient nonverbal  ? ?Physical Exam ?Updated Vital Signs ?There were no vitals taken for this visit. ?Physical Exam ?Vitals and nursing note reviewed.  ?Constitutional:   ?   Appearance: Normal appearance.  ?HENT:  ?   Head: Normocephalic and atraumatic.  ?   Right Ear: External ear normal.  ?   Left Ear: External ear normal.  ?   Nose: Nose normal.  ?   Mouth/Throat:  ?   Mouth: Mucous membranes are dry.  ?Eyes:  ?   Extraocular Movements: Extraocular movements intact.  ?   Conjunctiva/sclera: Conjunctivae normal.  ?   Pupils: Pupils are equal, round, and reactive to light.  ?Cardiovascular:  ?   Rate and Rhythm: Normal rate and regular rhythm.  ?   Pulses: Normal pulses.  ?   Heart  sounds: Normal heart sounds.  ?Pulmonary:  ?   Effort: Pulmonary effort is normal.  ?   Breath sounds: Normal breath sounds.  ?Abdominal:  ?   General: Abdomen is flat. Bowel sounds are normal.  ?Musculoskeletal:     ?   General: Normal range of motion.  ?   Cervical back: Normal range of motion and neck supple.  ?Skin: ?   General: Skin is warm.  ?   Capillary Refill: Capillary refill takes less than 2 seconds.  ?Neurological:  ?   Mental Status: He is alert. Mental status is at baseline.  ? ? ?ED Results / Procedures / Treatments   ?Labs ?(all labs ordered are listed, but only abnormal results are displayed) ?Labs Reviewed  ?COMPREHENSIVE METABOLIC PANEL  ?CBC WITH DIFFERENTIAL/PLATELET  ?MAGNESIUM  ?URINALYSIS, ROUTINE W REFLEX MICROSCOPIC  ?CBG MONITORING, ED  ? ? ?EKG ?EKG Interpretation ? ?Date/Time:  Friday May 14 2021 14:42:41 EDT ?Ventricular Rate:  103 ?PR Interval:  131 ?QRS Duration: 90 ?QT Interval:  326 ?QTC Calculation: 427 ?R Axis:   89 ?Text Interpretation: Sinus tachycardia RSR' in V1 or V2, right VCD or RVH ST elev, probable normal early repol pattern Since last tracing rate faster Confirmed by Jacalyn Lefevre [phone]) on 05/14/2021 3:07:57 PM ? ?Radiology ?No results found. ? ?Procedures ?Procedures  ? ? ?Medications Ordered in ED ?Medications  ?sodium chloride 0.9 % bolus 1,000 mL (has no administration in time range)  ?  And  ?0.9 %  sodium chloride infusion (has no administration in time range)  ? ? ?ED Course/ Medical Decision Making/ A&P ?  ?                        ?Medical Decision Making ?Amount and/or Complexity of Data Reviewed ?Labs: ordered. ?Radiology: ordered. ? ?Risk ?Prescription drug management. ? ? ?This patient presents to the ED for concern of seizure, this involves an extensive number of treatment options, and is a complaint that carries with it a high risk of complications and morbidity.  The differential diagnosis includes epileptic seizure, non-epileptic seizure, electrolyte  abn, infection ? ? ?Co morbidities that complicate the patient evaluation ? ?autism, intellectual disability, GERD, and ADHD ? ? ?Additional history obtained: ? ?Additional history obtained from epic chart review ?External records from outside source obtained and reviewed including EMS and caregiver ? ? ?Lab Tests: ? ?I Ordered labs  The pertinent results include:  labs pending at d/c ? ? ?Imaging Studies ordered: ? ?I ordered imaging studies including CT head  ?Results are pending at shift change ? ? ?Cardiac Monitoring: ? ?  The patient was maintained on a cardiac monitor.  I personally viewed and interpreted the cardiac monitored which showed an underlying rhythm of: ST initially ? ? ?Problem List / ED Course: ? ?Seizure:  labs and CT scan pending at shift change ?Social Determinants of Health: ? ?Lives at home with caregiver/legal guardian ? ? ?Dispostion: ?Dispo pending at shift change ? ? ? ? ? ? ?Final Clinical Impression(s) / ED Diagnoses ?Final diagnoses:  ?Seizure-like activity (HCC)  ? ? ?Rx / DC Orders ?ED Discharge Orders   ? ? None  ? ?  ? ? ?  ?Jacalyn LefevreHaviland, Janesia Joswick, MD ?05/14/21 1513 ? ?

## 2021-05-14 NOTE — Assessment & Plan Note (Signed)
Patient with first-time seizure-like activity after recent discontinuation of carbamazepine and switch to gabapentin.  Neurology recommended starting back on carbamazepine otherwise no other additional antiepileptics at this time unless recurrent seizure activity occurs. ?-Start back on carbamazepine 300 mg twice daily, hold gabapentin ?-Routine EEG ?-MRI brain -obtain if able per neurology ?-Seizure precautions ?-Neurology available as needed ?

## 2021-05-15 ENCOUNTER — Observation Stay (HOSPITAL_COMMUNITY): Payer: Medicaid Other

## 2021-05-15 DIAGNOSIS — R569 Unspecified convulsions: Secondary | ICD-10-CM

## 2021-05-15 DIAGNOSIS — F39 Unspecified mood [affective] disorder: Secondary | ICD-10-CM | POA: Diagnosis not present

## 2021-05-15 LAB — CBC
HCT: 42.3 % (ref 39.0–52.0)
Hemoglobin: 14.9 g/dL (ref 13.0–17.0)
MCH: 31.8 pg (ref 26.0–34.0)
MCHC: 35.2 g/dL (ref 30.0–36.0)
MCV: 90.4 fL (ref 80.0–100.0)
Platelets: 211 10*3/uL (ref 150–400)
RBC: 4.68 MIL/uL (ref 4.22–5.81)
RDW: 12 % (ref 11.5–15.5)
WBC: 9.4 10*3/uL (ref 4.0–10.5)
nRBC: 0 % (ref 0.0–0.2)

## 2021-05-15 LAB — BASIC METABOLIC PANEL
Anion gap: 6 (ref 5–15)
BUN: 9 mg/dL (ref 6–20)
CO2: 24 mmol/L (ref 22–32)
Calcium: 8.8 mg/dL — ABNORMAL LOW (ref 8.9–10.3)
Chloride: 109 mmol/L (ref 98–111)
Creatinine, Ser: 0.97 mg/dL (ref 0.61–1.24)
GFR, Estimated: >60 mL/min (ref >60)
Glucose, Bld: 105 mg/dL — ABNORMAL HIGH (ref 70–99)
Potassium: 2.9 mmol/L — ABNORMAL LOW (ref 3.5–5.1)
Sodium: 139 mmol/L (ref 135–145)

## 2021-05-15 LAB — MAGNESIUM: Magnesium: 2.5 mg/dL — ABNORMAL HIGH (ref 1.7–2.4)

## 2021-05-15 LAB — HIV ANTIBODY (ROUTINE TESTING W REFLEX): HIV Screen 4th Generation wRfx: NONREACTIVE

## 2021-05-15 MED ORDER — CARBAMAZEPINE ER 300 MG PO CP12: 300.0000 mg | ORAL_CAPSULE | Freq: Two times a day (BID) | ORAL | 0 refills | Status: DC

## 2021-05-15 MED ORDER — POTASSIUM CHLORIDE CRYS ER 20 MEQ PO TBCR
40.0000 meq | EXTENDED_RELEASE_TABLET | ORAL | Status: DC
  Administered 2021-05-15: 40 meq via ORAL
  Filled 2021-05-15: qty 2

## 2021-05-15 MED ORDER — POTASSIUM CHLORIDE CRYS ER 20 MEQ PO TBCR: 20.0000 meq | EXTENDED_RELEASE_TABLET | Freq: Every day | ORAL | 0 refills | Status: DC

## 2021-05-15 MED ORDER — CARBAMAZEPINE ER 200 MG PO TB12
300.0000 mg | ORAL_TABLET | Freq: Two times a day (BID) | ORAL | Status: DC
  Filled 2021-05-15: qty 1

## 2021-05-15 NOTE — ED Notes (Signed)
Breakfast order placed ?

## 2021-05-15 NOTE — ED Notes (Signed)
Patient transported to MRI 

## 2021-05-15 NOTE — Discharge Summary (Signed)
Physician Discharge Summary  ?[name] [id number] DOB: Jul 06, 1990 DOA: 05/14/2021 ? ?PCP: Nolene Ebbs, MD ? ?Admit date: 05/14/2021 ?Discharge date: 05/15/2021 ? ?Admitted From: Home ?Disposition: Home ? ?Recommendations for Outpatient Follow-up:  ?Follow up with PCP in 1-2 weeks ?Follow-up with your neuropsychiatry next week as scheduled. ? ?Home Health: N/A ?Equipment/Devices: N/A ? ?Discharge Condition: Stable ?CODE STATUS: Full code ?Diet recommendation: Regular diet ? ?Discharge summary: ?31 year old male with history of intellectual disability, ADHD, autism spectrum disorder, anxiety with agitation and aggressive behavior and GERD brought to the ER with seizure-like activity.  He lives in the home with the foster mom and caretaker who is taking care of him for 18 years.  Recently found with aggressive behavior and his carbamazepine was changed to gabapentin 5 days ago. ?Brought to ER and found with no new deficits, sent home. ?Driving with caretaker, suddenly began to have seizure-like activity, EMS called, was postictal brought to ER.  Subsequent CT head was normal.  MRI brain was normal with some evidence of recent seizure activities.  Electrolytes were adequate.  Treated with IV Keppra 1500 mg once. ?Case discussed with neurology who recommended to go back on carbamazepine and this was probably related to withdrawal from carbamazepine. ? ?Patient monitored in the hospital overnight.  Currently asymptomatic and back to usual mental status as per caretaker at the bedside. ?Continue all other home medications. ?Resume carbamazepine 300 mg twice daily.  Gabapentin was taken less than 4 days, will stop. ?They will discuss with his neuropsychiatry team. ?His potassium was low, given 40 mill equivalent in the hospital, will prescribe 20 mEq daily for 3 days.  Magnesium is adequate. ? 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 ?EEG with no seizure  activities. ? ?Discharge Diagnoses:  ?Principal Problem: ?  Seizure-like activity ([place]) ?Active Problems: ?  Mood disorder ([place]) ? ? ? ?Discharge Instructions ? ?Discharge Instructions   ? ? Diet general   Complete by: As directed ?  ? Increase activity slowly   Complete by: As directed ?  ? ?  ? ?Allergies as of 05/15/2021   ?No Known Allergies ?  ? ?  ?Medication List  ?  ? ?STOP taking these medications   ? ?gabapentin 400 MG capsule ?Commonly known as: NEURONTIN ?  ? ?  ? ?TAKE these medications   ? ?Asenapine Maleate 10 MG Subl ?Place 10 mg under the tongue See admin instructions. 12 pm and 8 pm ?  ?benzonatate 100 MG capsule ?Commonly known as: TESSALON ?Take 100 mg by mouth 2 (two) times daily. ?  ?Carbamazepine 300 MG Cp12 ?Commonly known as: Equetro ?Take 1 capsule (300 mg total) by mouth 2 (two) times daily. ?  ?cetirizine 10 MG tablet ?Commonly known as: ZYRTEC ?Take 10 mg by mouth every morning. ?  ?chlorproMAZINE 100 MG tablet ?Commonly known as: THORAZINE ?Take 100 mg by mouth in the morning and at bedtime. ?  ?clindamycin-benzoyl peroxide gel ?Commonly known as: BENZACLIN ?Apply 1 application topically 2 (two) times daily. ?  ?Ensure ?Take 237 mLs by mouth See admin instructions. 8 am and 12 pm ?  ?fluticasone 50 MCG/ACT nasal spray ?Commonly known as: FLONASE ?Place 1 spray into both nostrils at bedtime. ?  ?guanFACINE 2 MG tablet ?Commonly known as: TENEX ?Take 1 tablet (2 mg total) by mouth 2 (two) times daily. ?What changed:  ?when to take this ?additional instructions ?  ?LORazepam 1 MG tablet ?Commonly known as: ATIVAN ?Take  1 tablet (1 mg total) by mouth 3 (three) times daily. ?What changed:  ?when to take this ?additional instructions ?  ?omeprazole 20 MG capsule ?Commonly known as: PRILOSEC ?Take 20 mg by mouth 2 (two) times daily. ?  ?polyethylene glycol powder 17 GM/SCOOP powder ?Commonly known as: GLYCOLAX/MIRALAX ?Take 17 g by mouth daily. Mix in 8 oz milk and drink ?  ?potassium chloride  SA 20 MEQ tablet ?Commonly known as: KLOR-CON M ?Take 1 tablet (20 mEq total) by mouth daily for 3 days. Take only for 3 days ?  ?Vitamin D3 Super Strength 50 MCG (2000 UT) Tabs ?Generic drug: Cholecalciferol ?Take 2,000 Units by mouth daily. ?  ? ?  ? ? ?No Known Allergies ? ?Consultations: ?Neurology, curbside phone consultation. ? ? ?Procedures/Studies: ?CT HEAD WO CONTRAST ? ?Result Date: 05/14/2021 ?CLINICAL DATA:  Seizure EXAM: CT HEAD WITHOUT CONTRAST TECHNIQUE: Contiguous axial images were obtained from the base of the skull through the vertex without intravenous contrast. RADIATION DOSE REDUCTION: This exam was performed according to the departmental dose-optimization program which includes automated exposure control, adjustment of the mA and/or kV according to patient size and/or use of iterative reconstruction technique. COMPARISON:  None available. FINDINGS: Brain: There is no acute intracranial hemorrhage, extra-axial fluid collection, or acute infarct. Parenchymal volume is normal. The ventricles are normal in size. Gray-white differentiation is preserved. There is no mass lesion.  There is no mass effect or midline shift. Vascular: No hyperdense vessel or unexpected calcification. Skull: Normal. Negative for fracture or focal lesion. Sinuses/Orbits: Imaged paranasal sinuses are clear. The left globe is smaller than the right presumed chronic. Other: None. IMPRESSION: 1. No acute intracranial pathology. 2. Asymmetric small size of the left globe, likely related to prior rupture given history in the chart. Electronically Signed   By: Valetta Mole M.D.   On: 05/14/2021 15:49  ? ?MR BRAIN WO CONTRAST ? ?Result Date: 05/15/2021 ?CLINICAL DATA:  Seizure, new onset.  No history of trauma EXAM: MRI HEAD WITHOUT CONTRAST TECHNIQUE: Multiplanar, multiecho pulse sequences of the brain and surrounding structures were obtained without intravenous contrast. COMPARISON:  Head CT from yesterday FINDINGS: Brain: No  acute infarction, hemorrhage, hydrocephalus, extra-axial collection or mass lesion. 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 Single small remote white matter insult in the right frontal lobe, allowable for age. No cortical finding to correlate with seizure. Normal appearance of the bilateral hippocampus Vascular: Normal flow voids. Skull and upper cervical spine: Normal marrow signal. Sinuses/Orbits: Small left lobe with scleral thickening or calcification. History of left globe rupture. 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 No cortical findings. Electronically Signed   By: Jorje Guild M.D.   On: 05/15/2021 04:47  ? ?EEG adult ? ?Result Date: 05/15/2021 ?Lora Havens, MD     05/15/2021  9:25 AM Patient Name: Jameis Koltes MRN: [id number] Epilepsy Attending: Lora Havens Referring Physician/Provider: Donnetta Simpers, MD Date: 05/15/2021 Duration: 25.01 mins Patient history: 30yo m with seizure like activity. EEG to evaluate for seizure Level of alertness: Awake AEDs during EEG study: CBZ Technical aspects: This EEG study was done with scalp electrodes positioned according to the 10-20 International system of electrode placement. Electrical activity was acquired at a sampling rate of 500Hz  and reviewed with a high frequency filter of 70Hz  and a low frequency filter of 1Hz . EEG data were recorded continuously and digitally stored. Description: The posterior dominant rhythm  consists of 9 Hz activity of moderate voltage (25-35 uV) seen predominantly in posterior head regions, symmetric and reactive to eye opening and eye closing. Physiologic photic driving was seen during photic stimulation.  Hyperventilation was not performed.   IMPRESSION: This study is within normal limits. No seizures or epileptiform discharges were seen throughout the recording. Priyanka Barbra Sarks   ?(Echo, Carotid, EGD,  Colonoscopy, ERCP)  ? ? ?Subjective: Patient seen and examined.  His longtime caretaker and legal guardian Ms. Stacey at the bedside.  Patient was pleasant.  He was able to eat all his breakfast.  No more recurrent events after i

## 2021-05-15 NOTE — Procedures (Signed)
Patient Name: [name]  ?MRN: [id number]  ?Epilepsy Attending: Lora Havens  ?Referring Physician/Provider: Donnetta Simpers, MD ?Date: 05/15/2021 ?Duration: 25.01 mins ? ?Patient history: 31yo m with seizure like activity. EEG to evaluate for seizure ? ?Level of alertness: Awake ? ?AEDs during EEG study: CBZ ? ?Technical aspects: This EEG study was done with scalp electrodes positioned according to the 10-20 International system of electrode placement. Electrical activity was acquired at a sampling rate of 500Hz  and reviewed with a high frequency filter of 70Hz  and a low frequency filter of 1Hz . EEG data were recorded continuously and digitally stored.  ? ?Description: The posterior dominant rhythm consists of 9 Hz activity of moderate voltage (25-35 uV) seen predominantly in posterior head regions, symmetric and reactive to eye opening and eye closing. Physiologic photic driving was seen during photic stimulation.  Hyperventilation was not performed.    ? ?IMPRESSION: ?This study is within normal limits. No seizures or epileptiform discharges were seen throughout the recording. ? ?Lora Havens  ? ?

## 2021-05-15 NOTE — ED Notes (Signed)
Pt breakfast giving to caretaker. Caretaker helping pt with meal at this time ?

## 2021-05-15 NOTE — Progress Notes (Signed)
EEG complete - results pending 

## 2022-10-11 ENCOUNTER — Ambulatory Visit: Payer: Self-pay | Admitting: Dentistry

## 2022-10-11 DIAGNOSIS — F840 Autistic disorder: Secondary | ICD-10-CM

## 2022-10-12 ENCOUNTER — Encounter (HOSPITAL_COMMUNITY): Payer: Self-pay | Admitting: *Deleted

## 2022-10-12 ENCOUNTER — Other Ambulatory Visit: Payer: Self-pay

## 2022-10-12 NOTE — Progress Notes (Addendum)
SDW CALL  Patient's caregiver,Kimberly Means was given pre-op instructions over the phone. The opportunity was given for Cala Bradford to ask questions. No further questions asked. Kimberlyverbalized understanding of instructions given.   PCP - Sharlet Salina Mann,PA-medical clearance note 10/10/22 in chart Cardiologist - denies  PPM/ICD - denied Device Orders -  Rep Notified -   Chest x-ray - na EKG - na Stress Test -denied ECHO - denied Cardiac Cath - denied  Sleep Study - denied CPAP -   Fasting Blood Sugar -na  Checks Blood Sugar _____ times a day  Blood Thinner Instructions:na Aspirin Instructions:na  ERAS Protcol -clears until 0430 PRE-SURGERY Ensure or G2-   COVID TEST- na   Anesthesia review: yes- seizures,severe  intellectual disability  [employer] denies that patient has had  shortness of breath, fever, cough and chest pain over the phone call    Surgical Instructions    Your procedure is scheduled on October 10  Report to [facility] Main Entrance "A" at 0530 A.M., then check in with the Admitting office.  Call this number if you have problems the morning of surgery:  [phone]    Remember:  Do not eat after midnight the night before your surgery  You may drink clear liquids until 0430 the morning of your surgery.   Clear liquids allowed are: Water, Non-Citrus Juices (without pulp), Carbonated Beverages, Clear Tea, Black Coffee ONLY (NO MILK, CREAM OR POWDERED CREAMER of any kind), and Gatorade   Take these medicines the morning of surgery with A SIP OF WATER: Symmetrel,Asenapine,Zyrtec,Thorazine,Tenex,Vistail,Ativan,Prilosec.   As of today, STOP taking any Aspirin (unless otherwise instructed by your surgeon) Aleve, Naproxen, Ibuprofen, Motrin, Advil, Goody's, BC's, all herbal medications, fish oil, and all vitamins.  [facility] is not responsible for any belongings or valuables. .   Do NOT Smoke (Tobacco/Vaping)  24 hours prior to your  procedure  If you use a CPAP at night, you may bring your mask for your overnight stay.   Contacts, glasses, hearing aids, dentures or partials may not be worn into surgery, please bring cases for these belongings   Patients discharged the day of surgery will not be allowed to drive home, and someone needs to stay with them for 24 hours.   Special instructions:    Oral Hygiene is also important to reduce your risk of infection.  Remember - BRUSH YOUR TEETH THE MORNING OF SURGERY WITH YOUR REGULAR TOOTHPASTE   Day of Surgery:  Take a shower the day of or night before with antibacterial soap. Wear Clean/Comfortable clothing the morning of surgery Do not apply any deodorants/lotions.   Do not wear jewelry or makeup Do not wear lotions, powders, perfumes/colognes, or deodorant. Do not shave 48 hours prior to surgery.  Men may shave face and neck. Do not bring valuables to the hospital. Do not wear nail polish, gel polish, artificial nails, or any other type of covering on natural nails (fingers and toes) If you have artificial nails or gel coating that need to be removed by a nail salon, please have this removed prior to surgery. Artificial nails or gel coating may interfere with anesthesia's ability to adequately monitor your vital signs. Remember to brush your teeth WITH YOUR REGULAR TOOTHPASTE.

## 2022-10-12 NOTE — Anesthesia Preprocedure Evaluation (Signed)
Anesthesia Evaluation  Patient identified by MRN, date of birth, ID band Patient awake    Reviewed: Allergy & Precautions, NPO status , Patient's Chart, lab work & pertinent test results  Airway   TM Distance: >3 FB Neck ROM: Full   Comment: Unable to assess MP, pt with MR and unable to follow directions Dental  (+) Teeth Intact, Dental Advisory Given   Pulmonary neg pulmonary ROS   breath sounds clear to auscultation       Cardiovascular negative cardio ROS  Rhythm:Regular Rate:Normal     Neuro/Psych  PSYCHIATRIC DISORDERS Anxiety     negative neurological ROS     GI/Hepatic Neg liver ROS,GERD  ,,  Endo/Other  negative endocrine ROS    Renal/GU negative Renal ROS  negative genitourinary   Musculoskeletal negative musculoskeletal ROS (+)    Abdominal   Peds  (+) ADHDAutism, mild MR,    Hematology negative hematology ROS (+)   Anesthesia Other Findings Autism Mental disability  Reproductive/Obstetrics                             Anesthesia Physical Anesthesia Plan  ASA: III  Anesthesia Plan: General   Post-op Pain Management:    Induction: Intravenous  PONV Risk Score and Plan: 2 and Ondansetron, Midazolam and Treatment may vary due to age or medical condition  Airway Management Planned: Nasal ETT  Additional Equipment:   Intra-op Plan:   Post-operative Plan: Extubation in OR  Informed Consent: I have reviewed the patients History and Physical, chart, labs and discussed the procedure including the risks, benefits and alternatives for the proposed anesthesia with the patient or authorized representative who has indicated his/her understanding and acceptance.     Dental advisory given  Plan Discussed with: CRNA  Anesthesia Plan Comments: (PAT note written 10/12/2022 by Shonna Chock, PA-C.  )        Anesthesia Quick Evaluation

## 2022-10-12 NOTE — Progress Notes (Signed)
Anesthesia Chart Review: [name]  Case: [id number] Date/Time: 10/13/22 0715   Procedure: DENTAL RESTORATION/EXTRACTION WITH X-RAY   Anesthesia type: General   Pre-op diagnosis: dental caries   Location: [facility] ROOM 06 / [facility]   Surgeons: Joanna Hews, DMD       DISCUSSION: Patient is a 32 year old male scheduled for the above procedure.   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  Dwyane Luo, PA-C completed H&P on 10/10/22. He did CBC with diff, CMP, Lipid Panel (see [facility] Everywhere). Results included: Normal CBC with H/H 14.5/44.3, PLT 273, normal CMP except minimally elevated Alk phos of 123. Creatinine was 0.80, glucose 88. LDL 100, HDL 45. A1c on 12/31/21 was 5.3%.   Anesthesia team to evaluate on the day of surgery.    VS: Per H&P: BP 112/72, HR 100, RR 16.   PROVIDERS: [facility]   LABS: See DISCUSSION.   EEG 05/15/21: IMPRESSION: This study is within normal limits. No seizures or epileptiform discharges were seen throughout the recording.  EKG: N/A   CV: N/A  Past Medical History:  Diagnosis Date   ADHD (attention deficit hyperactivity disorder)    Anxiety    Autism    GERD (gastroesophageal reflux disease)    Gilles de la Tourette disorder    History of   Mental retardation    Moderate intellectual disabilities    Nail fungus    Nonverbal    Pneumonia 12/2012   Right lower lobe    Past Surgical History:  Procedure Laterality Date   EYE EXAMINATION UNDER ANESTHESIA Left 11/29/2017   Procedure: EYE EXAM UNDER ANESTHESIA WITH REMOVAL OF CORNEAL SUTURES OF LEFT EYE;  Surgeon: Aura Camps, MD;  Location: [facility];  Service: Ophthalmology;  Laterality: Left;   RUPTURED GLOBE EXPLORATION AND REPAIR Left 08/25/2017   Procedure: REPAIR OF RUPTURED GLOBE;  Surgeon: Aura Camps, MD;  Location:  [facility] OR;  Service: Ophthalmology;  Laterality: Left;    MEDICATIONS: No current facility-administered medications for this encounter.    amantadine (SYMMETREL) 100 MG capsule   Asenapine Maleate 10 MG SUBL   cetirizine (ZYRTEC) 10 MG tablet   chlorproMAZINE (THORAZINE) 100 MG tablet   ENSURE (ENSURE)   fluticasone (FLONASE) 50 MCG/ACT nasal spray   guanFACINE (TENEX) 2 MG tablet   hydrOXYzine (VISTARIL) 25 MG capsule   LORazepam (ATIVAN) 1 MG tablet   methylphenidate (CONCERTA) 18 MG PO CR tablet   omeprazole (PRILOSEC) 20 MG capsule   polyethylene glycol powder (GLYCOLAX/MIRALAX) powder   Vitamin D, Ergocalciferol, (DRISDOL) 1.25 MG (50000 UNIT) CAPS capsule    Shonna Chock, PA-C Surgical Short Stay/Anesthesiology [facility] Phone [phone] [facility] Phone [phone] 10/12/2022 11:03 AM

## 2022-10-13 ENCOUNTER — Encounter (HOSPITAL_COMMUNITY): Payer: Self-pay

## 2022-10-13 ENCOUNTER — Other Ambulatory Visit: Payer: Self-pay

## 2022-10-13 ENCOUNTER — Ambulatory Visit (HOSPITAL_COMMUNITY)
Admission: RE | Admit: 2022-10-13 | Discharge: 2022-10-13 | Disposition: A | Discharge status: Home / Self Care | Payer: MEDICAID | Attending: Dentistry | Admitting: Dentistry

## 2022-10-13 ENCOUNTER — Encounter (HOSPITAL_COMMUNITY)
Admission: RE | Disposition: A | Discharge status: Home / Self Care | Payer: Self-pay | Source: Home / Self Care | Attending: Dentistry

## 2022-10-13 ENCOUNTER — Ambulatory Visit (HOSPITAL_COMMUNITY): Payer: MEDICAID | Admitting: Anesthesiology

## 2022-10-13 ENCOUNTER — Ambulatory Visit (HOSPITAL_BASED_OUTPATIENT_CLINIC_OR_DEPARTMENT_OTHER): Payer: MEDICAID | Admitting: Anesthesiology

## 2022-10-13 DIAGNOSIS — F909 Attention-deficit hyperactivity disorder, unspecified type: Secondary | ICD-10-CM | POA: Diagnosis not present

## 2022-10-13 DIAGNOSIS — K029 Dental caries, unspecified: Secondary | ICD-10-CM | POA: Insufficient documentation

## 2022-10-13 DIAGNOSIS — F419 Anxiety disorder, unspecified: Secondary | ICD-10-CM | POA: Diagnosis not present

## 2022-10-13 DIAGNOSIS — F840 Autistic disorder: Secondary | ICD-10-CM | POA: Diagnosis not present

## 2022-10-13 DIAGNOSIS — K219 Gastro-esophageal reflux disease without esophagitis: Secondary | ICD-10-CM | POA: Diagnosis not present

## 2022-10-13 DIAGNOSIS — F79 Unspecified intellectual disabilities: Secondary | ICD-10-CM | POA: Insufficient documentation

## 2022-10-13 DIAGNOSIS — G40909 Epilepsy, unspecified, not intractable, without status epilepticus: Secondary | ICD-10-CM | POA: Diagnosis not present

## 2022-10-13 HISTORY — PX: DENTAL RESTORATION/EXTRACTION WITH X-RAY: SHX5796

## 2022-10-13 SURGERY — DENTAL RESTORATION/EXTRACTION WITH X-RAY
Anesthesia: General

## 2022-10-13 MED ORDER — DEXAMETHASONE SODIUM PHOSPHATE 10 MG/ML IJ SOLN
INTRAMUSCULAR | Status: DC | PRN
  Administered 2022-10-13: 10 mg via INTRAVENOUS

## 2022-10-13 MED ORDER — MIDAZOLAM HCL 2 MG/2ML IJ SOLN
INTRAMUSCULAR | Status: DC | PRN
  Administered 2022-10-13: 2 mg via INTRAVENOUS

## 2022-10-13 MED ORDER — FENTANYL CITRATE (PF) 250 MCG/5ML IJ SOLN
INTRAMUSCULAR | Status: AC
  Filled 2022-10-13: qty 5

## 2022-10-13 MED ORDER — LIDOCAINE HCL 2 % IJ SOLN
INTRAMUSCULAR | Status: DC | PRN
  Administered 2022-10-13: 4.1 mL via INTRAMUSCULAR

## 2022-10-13 MED ORDER — PROPOFOL 10 MG/ML IV BOLUS
INTRAVENOUS | Status: AC
  Filled 2022-10-13: qty 20

## 2022-10-13 MED ORDER — AMISULPRIDE (ANTIEMETIC) 5 MG/2ML IV SOLN: 10.0000 mg | Freq: Once | INTRAVENOUS | Status: DC | PRN

## 2022-10-13 MED ORDER — CEFAZOLIN SODIUM-DEXTROSE 2-4 GM/100ML-% IV SOLN
INTRAVENOUS | Status: AC
  Filled 2022-10-13: qty 100

## 2022-10-13 MED ORDER — OXYCODONE HCL 5 MG PO TABS: 5.0000 mg | ORAL_TABLET | Freq: Once | ORAL | Status: DC | PRN

## 2022-10-13 MED ORDER — CEFAZOLIN SODIUM-DEXTROSE 2-3 GM-%(50ML) IV SOLR
INTRAVENOUS | Status: DC | PRN
Start: 2022-10-13 — End: 2022-10-13
  Administered 2022-10-13: 2 g via INTRAVENOUS

## 2022-10-13 MED ORDER — FENTANYL CITRATE (PF) 250 MCG/5ML IJ SOLN
INTRAMUSCULAR | Status: DC | PRN
  Administered 2022-10-13: 100 ug via INTRAVENOUS
  Administered 2022-10-13 (×3): 50 ug via INTRAVENOUS

## 2022-10-13 MED ORDER — LACTATED RINGERS IV SOLN: INTRAVENOUS | Status: DC

## 2022-10-13 MED ORDER — LIDOCAINE 2% (20 MG/ML) 5 ML SYRINGE
INTRAMUSCULAR | Status: AC
  Filled 2022-10-13: qty 5

## 2022-10-13 MED ORDER — MIDAZOLAM HCL 2 MG/2ML IJ SOLN
INTRAMUSCULAR | Status: AC
  Filled 2022-10-13: qty 2

## 2022-10-13 MED ORDER — HYDROMORPHONE HCL 1 MG/ML IJ SOLN: 0.2500 mg | INTRAMUSCULAR | Status: DC | PRN

## 2022-10-13 MED ORDER — ORAL CARE MOUTH RINSE: 15.0000 mL | Freq: Once | OROMUCOSAL | Status: DC

## 2022-10-13 MED ORDER — LIDOCAINE-EPINEPHRINE 2 %-1:100000 IJ SOLN
INTRAMUSCULAR | Status: AC
  Filled 2022-10-13: qty 3.4

## 2022-10-13 MED ORDER — OXYMETAZOLINE HCL 0.05 % NA SOLN
NASAL | Status: DC | PRN
  Administered 2022-10-13: 1 via TOPICAL

## 2022-10-13 MED ORDER — DEXAMETHASONE SODIUM PHOSPHATE 10 MG/ML IJ SOLN
INTRAMUSCULAR | Status: AC
  Filled 2022-10-13: qty 1

## 2022-10-13 MED ORDER — ROCURONIUM BROMIDE 10 MG/ML (PF) SYRINGE
PREFILLED_SYRINGE | INTRAVENOUS | Status: DC | PRN
  Administered 2022-10-13: 50 mg via INTRAVENOUS

## 2022-10-13 MED ORDER — KETOROLAC TROMETHAMINE 30 MG/ML IJ SOLN
INTRAMUSCULAR | Status: AC
  Filled 2022-10-13: qty 1

## 2022-10-13 MED ORDER — ONDANSETRON HCL 4 MG/2ML IJ SOLN
INTRAMUSCULAR | Status: DC | PRN
  Administered 2022-10-13: 4 mg via INTRAVENOUS

## 2022-10-13 MED ORDER — KETOROLAC TROMETHAMINE 30 MG/ML IJ SOLN
INTRAMUSCULAR | Status: DC | PRN
Start: 2022-10-13 — End: 2022-10-13
  Administered 2022-10-13: 30 mg via INTRAVENOUS

## 2022-10-13 MED ORDER — LIDOCAINE 2% (20 MG/ML) 5 ML SYRINGE
INTRAMUSCULAR | Status: DC | PRN
  Administered 2022-10-13: 30 mg via INTRAVENOUS

## 2022-10-13 MED ORDER — SODIUM CHLORIDE 0.9 % IV SOLN: 12.5000 mg | INTRAVENOUS | Status: DC | PRN

## 2022-10-13 MED ORDER — MEPERIDINE HCL 25 MG/ML IJ SOLN: 6.2500 mg | INTRAMUSCULAR | Status: DC | PRN

## 2022-10-13 MED ORDER — ONDANSETRON HCL 4 MG/2ML IJ SOLN
INTRAMUSCULAR | Status: AC
  Filled 2022-10-13: qty 2

## 2022-10-13 MED ORDER — ROCURONIUM BROMIDE 10 MG/ML (PF) SYRINGE
PREFILLED_SYRINGE | INTRAVENOUS | Status: AC
  Filled 2022-10-13: qty 10

## 2022-10-13 MED ORDER — PROPOFOL 10 MG/ML IV BOLUS
INTRAVENOUS | Status: DC | PRN
  Administered 2022-10-13: 150 mg via INTRAVENOUS
  Administered 2022-10-13: 30 mg via INTRAVENOUS

## 2022-10-13 MED ORDER — OXYCODONE HCL 5 MG/5ML PO SOLN: 5.0000 mg | Freq: Once | ORAL | Status: DC | PRN

## 2022-10-13 MED ORDER — OXYMETAZOLINE HCL 0.05 % NA SOLN
NASAL | Status: AC
  Filled 2022-10-13: qty 30

## 2022-10-13 MED ORDER — OXYMETAZOLINE HCL 0.05 % NA SOLN
NASAL | Status: DC | PRN
Start: 2022-10-13 — End: 2022-10-13
  Administered 2022-10-13 (×2): 2 via NASAL

## 2022-10-13 MED ORDER — DEXMEDETOMIDINE HCL IN NACL 80 MCG/20ML IV SOLN
INTRAVENOUS | Status: DC | PRN
Start: 2022-10-13 — End: 2022-10-13
  Administered 2022-10-13 (×4): 8 ug via INTRAVENOUS

## 2022-10-13 MED ORDER — CHLORHEXIDINE GLUCONATE 0.12 % MT SOLN
OROMUCOSAL | Status: AC
  Filled 2022-10-13: qty 15

## 2022-10-13 MED ORDER — 0.9 % SODIUM CHLORIDE (POUR BTL) OPTIME
TOPICAL | Status: DC | PRN
Start: 2022-10-13 — End: 2022-10-13
  Administered 2022-10-13: 1000 mL

## 2022-10-13 MED ORDER — CHLORHEXIDINE GLUCONATE 0.12 % MT SOLN
15.0000 mL | Freq: Once | OROMUCOSAL | Status: DC
Start: 2022-10-13 — End: 2022-10-13

## 2022-10-13 MED ORDER — SUGAMMADEX SODIUM 200 MG/2ML IV SOLN
INTRAVENOUS | Status: DC | PRN
  Administered 2022-10-13: 150 mg via INTRAVENOUS

## 2022-10-13 SURGICAL SUPPLY — 26 items
BLADE SURG 15 STRL LF DISP TIS (BLADE) ×1 IMPLANT
BLADE SURG 15 STRL SS (BLADE) ×1
CANISTER SUCT 3000ML PPV (MISCELLANEOUS) ×1 IMPLANT
COVER BACK TABLE 60X90IN (DRAPES) ×1 IMPLANT
COVER MAYO STAND STRL (DRAPES) ×1 IMPLANT
COVER SURGICAL LIGHT HANDLE (MISCELLANEOUS) ×1 IMPLANT
DRAPE HALF SHEET 40X57 (DRAPES) ×1 IMPLANT
GAUZE PACKING FOLDED 2 STR (GAUZE/BANDAGES/DRESSINGS) ×1 IMPLANT
GAUZE SPONGE 4X4 16PLY XRAY LF (GAUZE/BANDAGES/DRESSINGS) ×1 IMPLANT
GLOVE BIO SURGEON STRL SZ 6.5 (GLOVE) ×1 IMPLANT
GOWN STRL REUS W/ TWL LRG LVL3 (GOWN DISPOSABLE) ×2 IMPLANT
GOWN STRL REUS W/TWL LRG LVL3 (GOWN DISPOSABLE) ×2
KIT BASIN OR (CUSTOM PROCEDURE TRAY) ×1 IMPLANT
KIT TURNOVER KIT B (KITS) ×1 IMPLANT
NDL DENTAL 27 LONG (NEEDLE) ×1 IMPLANT
NEEDLE DENTAL 27 LONG (NEEDLE)
PAD ARMBOARD 7.5X6 YLW CONV (MISCELLANEOUS) ×2 IMPLANT
SPONGE SURGIFOAM ABS GEL SZ50 (HEMOSTASIS) IMPLANT
SUT CHROMIC 3 0 PS 2 (SUTURE) ×1 IMPLANT
SUT CHROMIC 3 0 SH 27 (SUTURE) IMPLANT
SYR BULB IRRIG 60ML STRL (SYRINGE) ×1 IMPLANT
TOOTHBRUSH ADULT (PERSONAL CARE ITEMS) ×1 IMPLANT
TOWEL GREEN STERILE (TOWEL DISPOSABLE) ×1 IMPLANT
TUBE CONNECTING 12X1/4 (SUCTIONS) ×1 IMPLANT
WATER TABLETS ICX (MISCELLANEOUS) ×1 IMPLANT
YANKAUER SUCT BULB TIP NO VENT (SUCTIONS) ×1 IMPLANT

## 2022-10-13 NOTE — Transfer of Care (Signed)
Immediate Anesthesia Transfer of Care Note  Patient: [name]  Procedure(s) Performed: DENTAL RESTORATION/EXTRACTION WITH X-RAY, FULL MOUTH DEBRIDEMENT  Patient Location: PACU  Anesthesia Type:General  Level of Consciousness: drowsy and responds to stimulation  Airway & Oxygen Therapy: Patient Spontanous Breathing and Patient connected to face mask oxygen  Post-op Assessment: Report given to RN and Post -op Vital signs reviewed and stable  Post vital signs: Reviewed and stable  Last Vitals:  Vitals Value Taken Time  BP 120/69 10/13/22 1100  Temp 36.2 C 10/13/22 1058  Pulse 105 10/13/22 1105  Resp 24 10/13/22 1105  SpO2 99 % 10/13/22 1105  Vitals shown include unfiled device data.  Last Pain:  Vitals:   10/13/22 0554  TempSrc: Oral         Complications: No notable events documented.

## 2022-10-13 NOTE — H&P (Signed)
H&P reviewed. Stable for surgery [name], DMD  

## 2022-10-13 NOTE — Anesthesia Procedure Notes (Signed)
Procedure Name: Intubation Date/Time: 10/13/2022 7:35 AM  Performed by: Shary Decamp, CRNAPre-anesthesia Checklist: Patient identified, Emergency Drugs available, Suction available and Patient being monitored Patient Re-evaluated:Patient Re-evaluated prior to induction Oxygen Delivery Method: Circle system utilized Preoxygenation: Pre-oxygenation with 100% oxygen Induction Type: IV induction Ventilation: Mask ventilation without difficulty Laryngoscope Size: Mac, 3 and Glidescope Grade View: Grade I Nasal Tubes: Nasal prep performed, Nasal Rae and Right Tube size: 7.0 mm Number of attempts: 1 Placement Confirmation: ETT inserted through vocal cords under direct vision, positive ETCO2 and breath sounds checked- equal and bilateral Tube secured with: Tape Dental Injury: Teeth and Oropharynx as per pre-operative assessment

## 2022-10-13 NOTE — Op Note (Addendum)
[facility]  10/13/2022 [name] [id number]  Preop DX: Dental caries/behavior management issues due to intellectual disabilities/developmental disabilities. Dental Care provided in OR for medically necessary treatment.  Surgeon: Joanna Hews, DMD  Assistant:  [facility] hospital staff.  Anesthesia: General  Procedure: The patient was brought into the operating room and placed on the table in a supine position.  General anesthesia was administered via nasal intubation.  The patient was prepped and draped in the usual manner for an intra-oral general dentistry procedure. The oropharynx was suctioned and a moistened oropharyngeal throat pack was placed.    A full intra-oral exam including all hard and soft tissues was performed.  Type of Exam: Recall   Soft Tissue Exam: Floor of the mouth: Normal Buccal mucosa: Normal Soft palate: Normal Hard palate: Normal Tongue: Normal Gingival: Normal Frenum: Normal  Hard tissue exam:  Present: # 1-3, 6-13, 15-16, 17-32 Missing: # 4,5,14 Radiographic findings decay: # 48M, 13D, 17, 12M, 30D Radiographic findings abscess: # 13  Full mouth series of digital radiographs taken and reviewed. A comprehensive treatment plan was developed.  Operative care was accomplished in a standard fashion using high/low speed drills with copious irrigation.  Routine extractions were accomplished with simple elevation and use of forceps. Surgical Extractions were done in a standard fashion with full facial thickness flaps to gain access, otectomy / osteoplasty with copious irrigation to expose the teeth. Teeth with multi-roots were sectioned as needed to minimize surgical trauma.    All surgical sites were irrigated with copious amounts of saline. Gel foam was placed in the sockets and hemostasis established with firm pressure. Surgical sites were closed with 3-0 Chromic sutures.  Local Anes:Lidocaine 2% with 1:100,000 epinephrine 4.1 mls The  estimated blood loss was 30 mls.   Upon completion of all procedures the oropharynx was irrigated of all debris. Mouth was suctioned dry and a posterior throat pack was carefully removed with constant suction. Hemostasis was established and a gauze pack was placed as an intraoral pressure dressing. After spontaneous respirations the patient was extubated and transported to the Post-Anesthesia care unit in awake but in a sedated condition. The patient tolerated the procedure well and without complications.  An explanation of procedures and extractions were given to caregiver.  Operative Procedures:  Full mouth debridement: Yes Extractions completed: # 1,3,13, 17,19, 32 Glass Ionomers: # 64F(coronal/smooth/dentin), 20D(coronal/smooth/dentin), 22DF(coronal/smooth/dentin), 29 DO(coronal/chewing/dentin), (coronal/chewing/dentin), 31O(coronal/chewing/dentin) Fluoride varnish: Yes   Postoperative Meds:  OTC ibuprofen 600mg  and tylenol 500mg  every 6 hours if needed for pain.   Postoperative Instructions: Extraction sheet signed and given to patient representative.    Joanna Hews, DMD

## 2022-10-13 NOTE — Anesthesia Postprocedure Evaluation (Signed)
Anesthesia Post Note  Patient: [name]  Procedure(s) Performed: DENTAL RESTORATION/EXTRACTION WITH X-RAY, FULL MOUTH DEBRIDEMENT     Patient location during evaluation: PACU Anesthesia Type: General Level of consciousness: awake and alert Pain management: pain level controlled Vital Signs Assessment: post-procedure vital signs reviewed and stable Respiratory status: spontaneous breathing, nonlabored ventilation and respiratory function stable Cardiovascular status: blood pressure returned to baseline and stable Postop Assessment: no apparent nausea or vomiting Anesthetic complications: no   No notable events documented.  Last Vitals:  Vitals:   10/13/22 1115 10/13/22 1130  BP: 118/68 105/63  Pulse: (!) 101 (!) 103  Resp: 16 13  Temp:  (!) 36.2 C  SpO2: 96% 97%    Last Pain:  Vitals:   10/13/22 0554  TempSrc: Oral                 Lowella Curb

## 2022-10-13 NOTE — Brief Op Note (Signed)
10/13/2022  11:12 AM  PATIENT:  [name]  32 y.o. male  PRE-OPERATIVE DIAGNOSIS:  dental caries  POST-OPERATIVE DIAGNOSIS:  dental caries  PROCEDURE:  Procedure(s) with comments: DENTAL RESTORATION/EXTRACTION WITH X-RAY, FULL MOUTH DEBRIDEMENT (N/A) - FILLINGS ON 55F 20D, 22DF, 29DO, 30 MOD, 31O   EXTRACTIONS on 1, 3, 13, 17, 19, 32  SURGEON:  Surgeons and Role:    * Yomaris Palecek, Arley Phenix, DMD - Primary  PHYSICIAN ASSISTANT:   ASSISTANTS: Shawnie Pons and hospital staff   ANESTHESIA:   general  EBL:  30 mL   BLOOD ADMINISTERED:none  DRAINS: none   LOCAL MEDICATIONS USED:  LIDOCAINE  and Amount: 4.1 ml  SPECIMEN:  No Specimen  DISPOSITION OF SPECIMEN:  N/A  COUNTS:  YES  TOURNIQUET:  * No tourniquets in log *  DICTATION: .Note written in EPIC  PLAN OF CARE: Discharge to home after PACU  PATIENT DISPOSITION:  PACU - hemodynamically stable.   Delay start of Pharmacological VTE agent (>24hrs) due to surgical blood loss or risk of bleeding: not applicable

## 2022-10-13 NOTE — H&P (Signed)
Anesthesia H&P Update: History and Physical Exam reviewed; patient is OK for planned anesthetic and procedure. ? ?

## 2022-10-14 ENCOUNTER — Encounter (HOSPITAL_COMMUNITY): Payer: Self-pay | Admitting: Dentistry
